# Patient Record
Sex: Female | Born: 1960 | Race: Black or African American | Hispanic: No | Marital: Married | Smoking: Never smoker
Health system: Southern US, Community
[De-identification: ages and names within clinical notes are randomized; demographics above are authoritative.]

## PROBLEM LIST (undated history)

## (undated) DIAGNOSIS — I10 Essential (primary) hypertension: Secondary | ICD-10-CM

## (undated) HISTORY — PX: APPENDECTOMY: SHX54

---

## 1999-06-29 ENCOUNTER — Encounter: Payer: Self-pay | Admitting: Obstetrics & Gynecology

## 1999-06-29 ENCOUNTER — Inpatient Hospital Stay (HOSPITAL_COMMUNITY): Admission: AD | Admit: 1999-06-29 | Discharge: 1999-06-29 | Payer: Self-pay | Admitting: Obstetrics & Gynecology

## 2005-03-13 ENCOUNTER — Ambulatory Visit: Payer: Self-pay | Admitting: Obstetrics and Gynecology

## 2006-01-24 ENCOUNTER — Ambulatory Visit: Payer: Self-pay | Admitting: Obstetrics and Gynecology

## 2007-01-28 ENCOUNTER — Ambulatory Visit: Payer: Self-pay | Admitting: Obstetrics and Gynecology

## 2008-01-21 ENCOUNTER — Ambulatory Visit: Payer: Self-pay | Admitting: Obstetrics and Gynecology

## 2008-01-30 ENCOUNTER — Ambulatory Visit: Payer: Self-pay | Admitting: Obstetrics and Gynecology

## 2009-05-12 ENCOUNTER — Ambulatory Visit: Payer: Self-pay

## 2010-05-17 ENCOUNTER — Ambulatory Visit: Payer: Self-pay

## 2010-06-29 ENCOUNTER — Ambulatory Visit (HOSPITAL_COMMUNITY): Admission: RE | Admit: 2010-06-29 | Discharge: 2010-06-29 | Payer: Self-pay | Admitting: Internal Medicine

## 2010-10-23 ENCOUNTER — Emergency Department: Payer: Self-pay | Admitting: Emergency Medicine

## 2011-09-10 ENCOUNTER — Ambulatory Visit: Payer: Self-pay | Admitting: Obstetrics and Gynecology

## 2013-07-16 ENCOUNTER — Ambulatory Visit: Payer: Self-pay | Admitting: Obstetrics and Gynecology

## 2014-11-15 ENCOUNTER — Encounter: Payer: Self-pay | Admitting: *Deleted

## 2015-01-06 ENCOUNTER — Other Ambulatory Visit: Payer: Self-pay | Admitting: Internal Medicine

## 2015-01-06 DIAGNOSIS — M19111 Post-traumatic osteoarthritis, right shoulder: Secondary | ICD-10-CM

## 2015-01-06 DIAGNOSIS — M25511 Pain in right shoulder: Secondary | ICD-10-CM

## 2015-01-13 ENCOUNTER — Ambulatory Visit
Admission: RE | Admit: 2015-01-13 | Discharge: 2015-01-13 | Disposition: A | Discharge status: Home / Self Care | Payer: BLUE CROSS/BLUE SHIELD | Source: Ambulatory Visit | Attending: Internal Medicine | Admitting: Internal Medicine

## 2015-01-13 DIAGNOSIS — M19111 Post-traumatic osteoarthritis, right shoulder: Secondary | ICD-10-CM

## 2015-01-13 DIAGNOSIS — M25511 Pain in right shoulder: Secondary | ICD-10-CM

## 2015-01-20 ENCOUNTER — Ambulatory Visit: Admission: RE | Admit: 2015-01-20 | Payer: BLUE CROSS/BLUE SHIELD | Source: Ambulatory Visit

## 2015-02-08 ENCOUNTER — Other Ambulatory Visit: Payer: Self-pay | Admitting: Internal Medicine

## 2015-02-08 DIAGNOSIS — Z1231 Encounter for screening mammogram for malignant neoplasm of breast: Secondary | ICD-10-CM

## 2015-02-09 ENCOUNTER — Ambulatory Visit
Admission: RE | Admit: 2015-02-09 | Discharge: 2015-02-09 | Disposition: A | Discharge status: Home / Self Care | Payer: BLUE CROSS/BLUE SHIELD | Source: Ambulatory Visit | Attending: Internal Medicine | Admitting: Internal Medicine

## 2015-02-09 DIAGNOSIS — Z1231 Encounter for screening mammogram for malignant neoplasm of breast: Secondary | ICD-10-CM | POA: Diagnosis present

## 2015-03-16 ENCOUNTER — Encounter: Payer: Self-pay | Admitting: *Deleted

## 2015-03-17 ENCOUNTER — Ambulatory Visit: Payer: BLUE CROSS/BLUE SHIELD | Admitting: Anesthesiology

## 2015-03-17 ENCOUNTER — Encounter
Admission: RE | Disposition: A | Discharge status: Home / Self Care | Payer: Self-pay | Source: Ambulatory Visit | Attending: Gastroenterology

## 2015-03-17 ENCOUNTER — Ambulatory Visit
Admission: RE | Admit: 2015-03-17 | Discharge: 2015-03-17 | Disposition: A | Discharge status: Home / Self Care | Payer: BLUE CROSS/BLUE SHIELD | Source: Ambulatory Visit | Attending: Gastroenterology | Admitting: Gastroenterology

## 2015-03-17 ENCOUNTER — Encounter: Payer: Self-pay | Admitting: *Deleted

## 2015-03-17 DIAGNOSIS — I10 Essential (primary) hypertension: Secondary | ICD-10-CM | POA: Diagnosis not present

## 2015-03-17 DIAGNOSIS — Z1211 Encounter for screening for malignant neoplasm of colon: Secondary | ICD-10-CM | POA: Insufficient documentation

## 2015-03-17 HISTORY — DX: Essential (primary) hypertension: I10

## 2015-03-17 HISTORY — PX: COLONOSCOPY WITH PROPOFOL: SHX5780

## 2015-03-17 SURGERY — COLONOSCOPY WITH PROPOFOL
Anesthesia: General

## 2015-03-17 MED ORDER — SODIUM CHLORIDE 0.9 % IV SOLN
INTRAVENOUS | Status: DC
  Administered 2015-03-17: 10:00:00 via INTRAVENOUS

## 2015-03-17 MED ORDER — SODIUM CHLORIDE 0.9 % IV SOLN: INTRAVENOUS | Status: DC

## 2015-03-17 MED ORDER — PROPOFOL 10 MG/ML IV BOLUS
INTRAVENOUS | Status: DC | PRN
  Administered 2015-03-17: 100 mg via INTRAVENOUS

## 2015-03-17 MED ORDER — PHENYLEPHRINE HCL 10 MG/ML IJ SOLN
INTRAMUSCULAR | Status: DC | PRN
  Administered 2015-03-17: 100 ug via INTRAVENOUS

## 2015-03-17 MED ORDER — LIDOCAINE HCL (CARDIAC) 20 MG/ML IV SOLN
INTRAVENOUS | Status: DC | PRN
  Administered 2015-03-17: 20 mg via INTRAVENOUS

## 2015-03-17 MED ORDER — PROPOFOL INFUSION 10 MG/ML OPTIME
INTRAVENOUS | Status: DC | PRN
  Administered 2015-03-17: 100 ug/kg/min via INTRAVENOUS

## 2015-03-17 NOTE — H&P (Signed)
  Primary Care Physician:  Luna Fuse, MD  Pre-Procedure History & Physical: HPI:  Vanessa Walters is a 54 y.o. female is here for an colonoscopy.   Past Medical History  Diagnosis Date  . Hypertension     Past Surgical History  Procedure Laterality Date  . Appendectomy    . Cesarean section      Prior to Admission medications   Medication Sig Start Date End Date Taking? Authorizing Provider  ascorbic acid (VITAMIN C) 500 MG tablet Take 500 mg by mouth daily.   Yes Historical Provider, MD  calcium carbonate (OS-CAL) 600 MG TABS tablet Take 600 mg by mouth 2 (two) times daily with a meal.   Yes Historical Provider, MD  cyclobenzaprine (FLEXERIL) 5 MG tablet Take 5 mg by mouth 3 (three) times daily as needed for muscle spasms.   Yes Historical Provider, MD  lisinopril-hydrochlorothiazide (PRINZIDE,ZESTORETIC) 20-25 MG per tablet Take 1 tablet by mouth daily.   Yes Historical Provider, MD  Multiple Vitamin (MULTIVITAMIN) tablet Take 1 tablet by mouth daily.   Yes Historical Provider, MD    Allergies as of 03/13/2015  . (Not on File)    History reviewed. No pertinent family history.  History   Social History  . Marital Status: Married    Spouse Name: N/A  . Number of Children: N/A  . Years of Education: N/A   Occupational History  . Not on file.   Social History Main Topics  . Smoking status: Never Smoker   . Smokeless tobacco: Not on file  . Alcohol Use: No  . Drug Use: No  . Sexual Activity: Not on file   Other Topics Concern  . Not on file   Social History Narrative     Physical Exam: BP 122/84 mmHg  Pulse 72  Temp(Src) 98.1 F (36.7 C) (Tympanic)  Resp 22  Ht  (1.676 m)  Wt 107.956 kg (238 lb)  BMI 38.43 kg/m2  SpO2 100% General:   Alert,  pleasant and cooperative in NAD Head:  Normocephalic and atraumatic. Neck:  Supple; no masses or thyromegaly. Lungs:  Clear throughout to auscultation.    Heart:  Regular rate and  rhythm. Abdomen:  Soft, nontender and nondistended. Normal bowel sounds, without guarding, and without rebound.   Neurologic:  Alert and  oriented x4;  grossly normal neurologically.  Impression/Plan: Vanessa Walters is here for an colonoscopy to be performed for screening  Risks, benefits, limitations, and alternatives regarding  colonoscopy have been reviewed with the patient.  Questions have been answered.  All parties agreeable.   Vanessa Maxwell, MD  03/17/2015, 10:17 AM

## 2015-03-17 NOTE — Anesthesia Postprocedure Evaluation (Signed)
  Anesthesia Post-op Note  Patient: Vanessa Walters  Procedure(s) Performed: Procedure(s): COLONOSCOPY WITH PROPOFOL (N/A)  Anesthesia type:General  Patient location: PACU  Post pain: Pain level controlled  Post assessment: Post-op Vital signs reviewed, Patient's Cardiovascular Status Stable, Respiratory Function Stable, Patent Airway and No signs of Nausea or vomiting  Post vital signs: Reviewed and stable  Last Vitals:  Filed Vitals:   03/17/15 1125  BP:   Pulse: 64  Temp:   Resp:     Level of consciousness: awake, alert  and patient cooperative  Complications: No apparent anesthesia complications

## 2015-03-17 NOTE — Anesthesia Preprocedure Evaluation (Addendum)
Anesthesia Evaluation  Patient identified by MRN, date of birth, ID band Patient awake    Reviewed: Allergy & Precautions, H&P , NPO status , Patient's Chart, lab work & pertinent test results, reviewed documented beta blocker date and time   Airway Mallampati: III  TM Distance: >3 FB Neck ROM: limited    Dental no notable dental hx. (+) Teeth Intact   Pulmonary neg pulmonary ROS,  breath sounds clear to auscultation  Pulmonary exam normal       Cardiovascular hypertension, Normal cardiovascular examRhythm:regular Rate:Normal     Neuro/Psych negative neurological ROS  negative psych ROS   GI/Hepatic negative GI ROS, Neg liver ROS,   Endo/Other  negative endocrine ROSMorbid obesity  Renal/GU negative Renal ROS  negative genitourinary   Musculoskeletal   Abdominal   Peds  Hematology negative hematology ROS (+)   Anesthesia Other Findings Past Medical History:   Hypertension                                                 Signs and symptoms suggestive of sleep apnea    Reproductive/Obstetrics negative OB ROS                            Anesthesia Physical Anesthesia Plan  ASA: III  Anesthesia Plan: General   Post-op Pain Management:    Induction:   Airway Management Planned:   Additional Equipment:   Intra-op Plan:   Post-operative Plan:   Informed Consent: I have reviewed the patients History and Physical, chart, labs and discussed the procedure including the risks, benefits and alternatives for the proposed anesthesia with the patient or authorized representative who has indicated his/her understanding and acceptance.   Dental Advisory Given  Plan Discussed with: Anesthesiologist, CRNA and Surgeon  Anesthesia Plan Comments:         Anesthesia Quick Evaluation

## 2015-03-17 NOTE — Op Note (Signed)
[facility] Gastroenterology Patient Name: Vanessa Walters Procedure Date: 03/17/2015 10:18 AM MRN: [id number] Account #: [id number] Date of Birth: 1961-05-20 Admit Type: Outpatient Age: 54 Room: [facility] ENDO ROOM 1 Gender: Female Note Status: Finalized Procedure:         Colonoscopy Indications:       Screening for colorectal malignant neoplasm, Last                     colonoscopy well over 10 years ago Patient Profile:   This is a 54 year old female. Providers:         Rhona Raider. Shelle Iron, MD Referring MD:      Silas Flood. Ellsworth Lennox, MD (Referring MD) Medicines:         Propofol per Anesthesia Complications:     No immediate complications. Procedure:         Pre-Anesthesia Assessment:                    - Prior to the procedure, a History and Physical was                     performed, and patient medications, allergies and                     sensitivities were reviewed. The patient's tolerance of                     previous anesthesia was reviewed.                    After obtaining informed consent, the colonoscope was                     passed under direct vision. Throughout the procedure, the                     patient's blood pressure, pulse, and oxygen saturations                     were monitored continuously. The Colonoscope was                     introduced through the anus and advanced to the the cecum,                     identified by appendiceal orifice and ileocecal valve. The                     colonoscopy was performed without difficulty. The patient                     tolerated the procedure well. The quality of the bowel                     preparation was excellent. Findings:      The perianal exam findings include non-thrombosed external hemorrhoids.      The entire examined colon appeared normal on direct and retroflexion       views. Impression:        - Non-thrombosed external hemorrhoids found on perianal                     exam.               - The entire examined colon is normal on direct  and                     retroflexion views.                    - No specimens collected. Recommendation:    - Observe patient in GI recovery unit.                    - High fiber diet.                    - Continue present medications.                    - Repeat colonoscopy in 10 years for screening purposes.                    - Return to referring physician.                    - The findings and recommendations were discussed with the                     patient.                    - The findings and recommendations were discussed with the                     patient's family. Procedure Code(s): --- Professional ---                    [phone], Colonoscopy, flexible; diagnostic, including                     collection of specimen(s) by brushing or washing, when                     performed (separate procedure) CPT copyright 2014 [employer]. All rights reserved. The codes documented in this report are preliminary and upon coder review may  be revised to meet current compliance requirements. Kathalene Frames, MD 03/17/2015 10:50:42 AM This report has been signed electronically. Number of Addenda: 0 Note Initiated On: 03/17/2015 10:18 AM Scope Withdrawal Time: 0 hours 14 minutes 18 seconds  Total Procedure Duration: 0 hours 18 minutes 24 seconds       [facility]

## 2015-03-17 NOTE — Transfer of Care (Signed)
Immediate Anesthesia Transfer of Care Note  Patient: Vanessa Walters  Procedure(s) Performed: Procedure(s): COLONOSCOPY WITH PROPOFOL (N/A)  Patient Location: PACU and Endoscopy Unit  Anesthesia Type:General  Level of Consciousness: awake  Airway & Oxygen Therapy: Patient Spontanous Breathing and Patient connected to nasal cannula oxygen  Post-op Assessment: Report given to RN and Post -op Vital signs reviewed and stable  Post vital signs: Reviewed and stable  Last Vitals:  Filed Vitals:   03/17/15 0956  BP: 122/84  Pulse: 72  Temp: 36.7 C  Resp: 22    Complications: No apparent anesthesia complications

## 2015-03-17 NOTE — Discharge Instructions (Signed)

## 2016-05-03 ENCOUNTER — Other Ambulatory Visit: Payer: Self-pay | Admitting: Internal Medicine

## 2016-05-03 DIAGNOSIS — Z1231 Encounter for screening mammogram for malignant neoplasm of breast: Secondary | ICD-10-CM

## 2016-05-24 ENCOUNTER — Ambulatory Visit
Admission: RE | Admit: 2016-05-24 | Discharge: 2016-05-24 | Disposition: A | Discharge status: Home / Self Care | Payer: BLUE CROSS/BLUE SHIELD | Source: Ambulatory Visit | Attending: Internal Medicine | Admitting: Internal Medicine

## 2016-05-24 DIAGNOSIS — Z1231 Encounter for screening mammogram for malignant neoplasm of breast: Secondary | ICD-10-CM | POA: Diagnosis present

## 2016-08-07 IMAGING — MG MM DIGITAL SCREENING BILATERAL
5 series · 5 of 5 positions shown · non-contrast
Comparison: Previous exam(s).

CLINICAL DATA: Screening.

EXAM:
DIGITAL SCREENING BILATERAL MAMMOGRAM WITH CAD

[R CC]
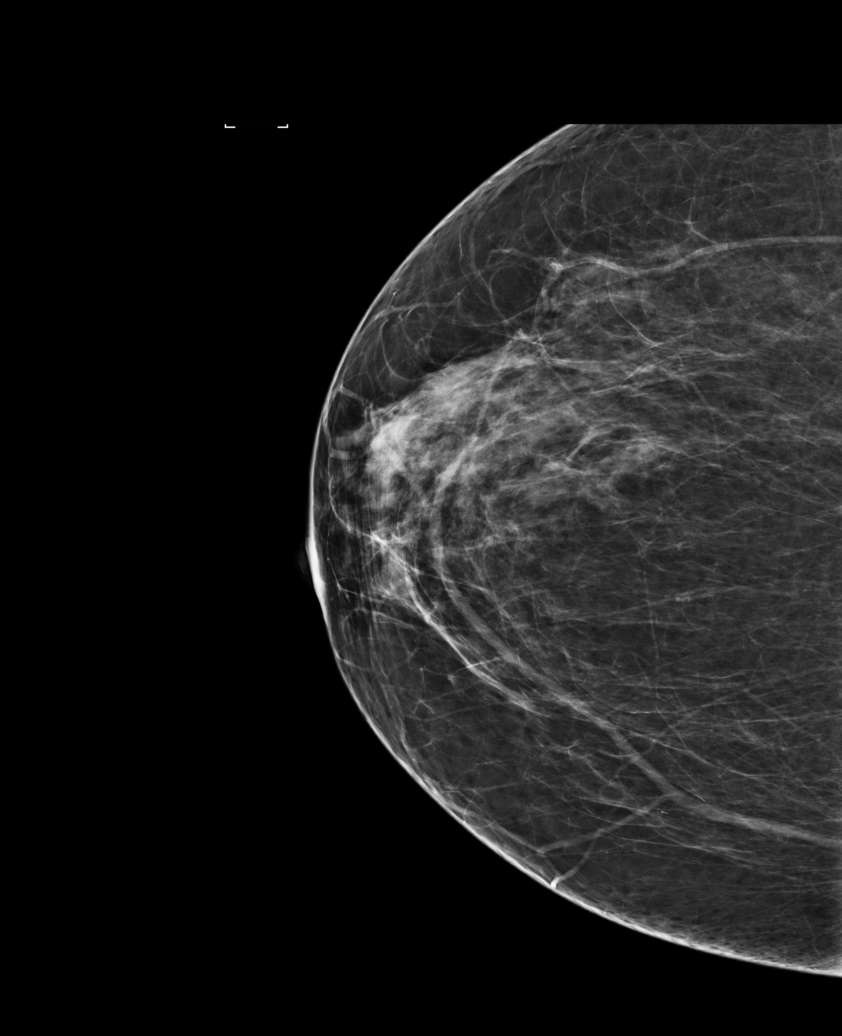

[R MLO]
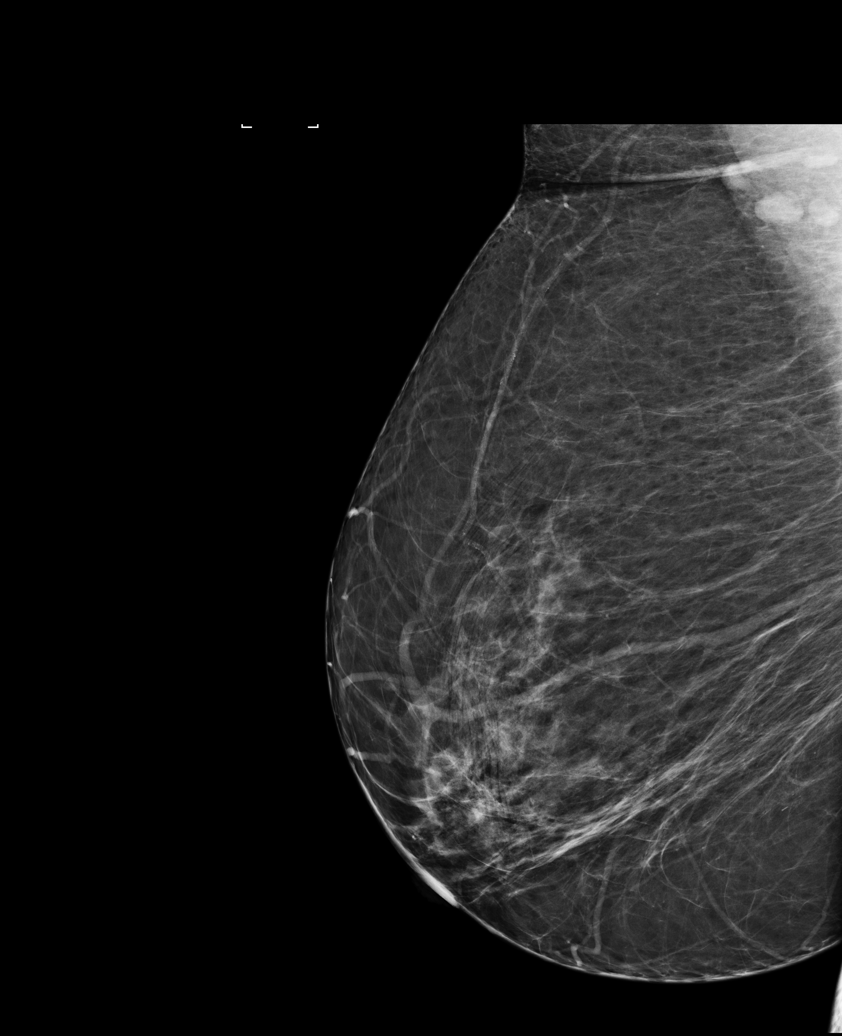

[L MLO (1 of 2)]
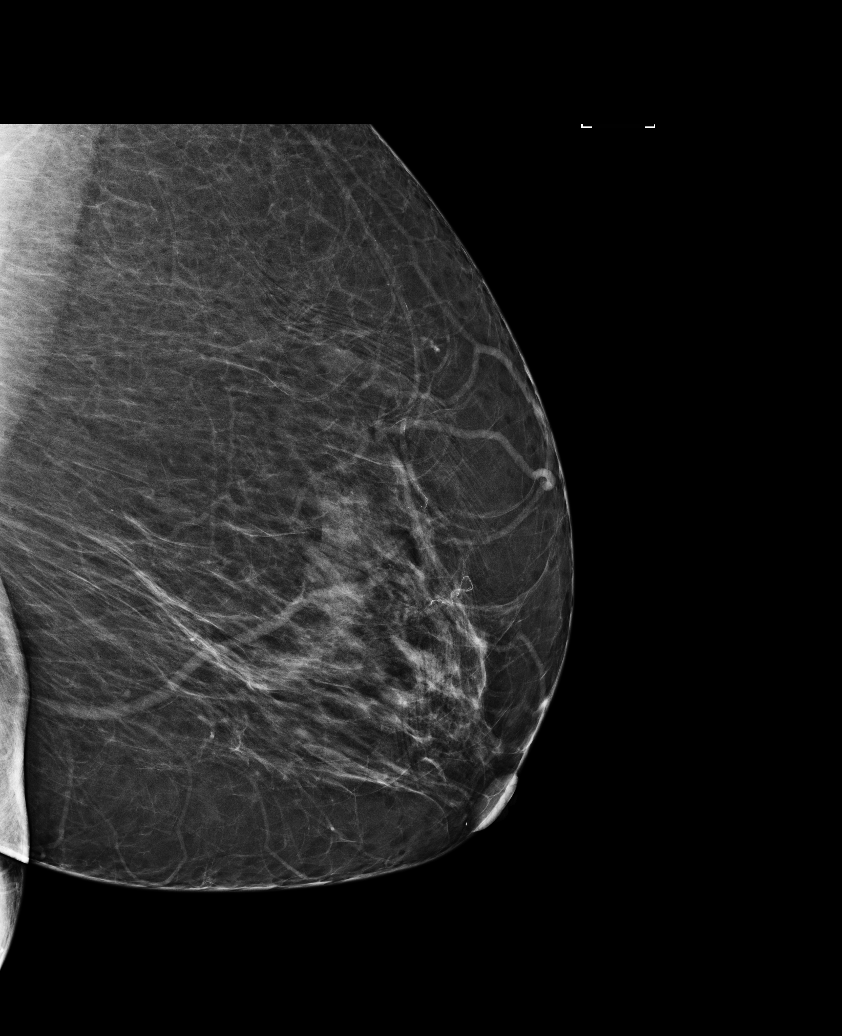

[L CC]
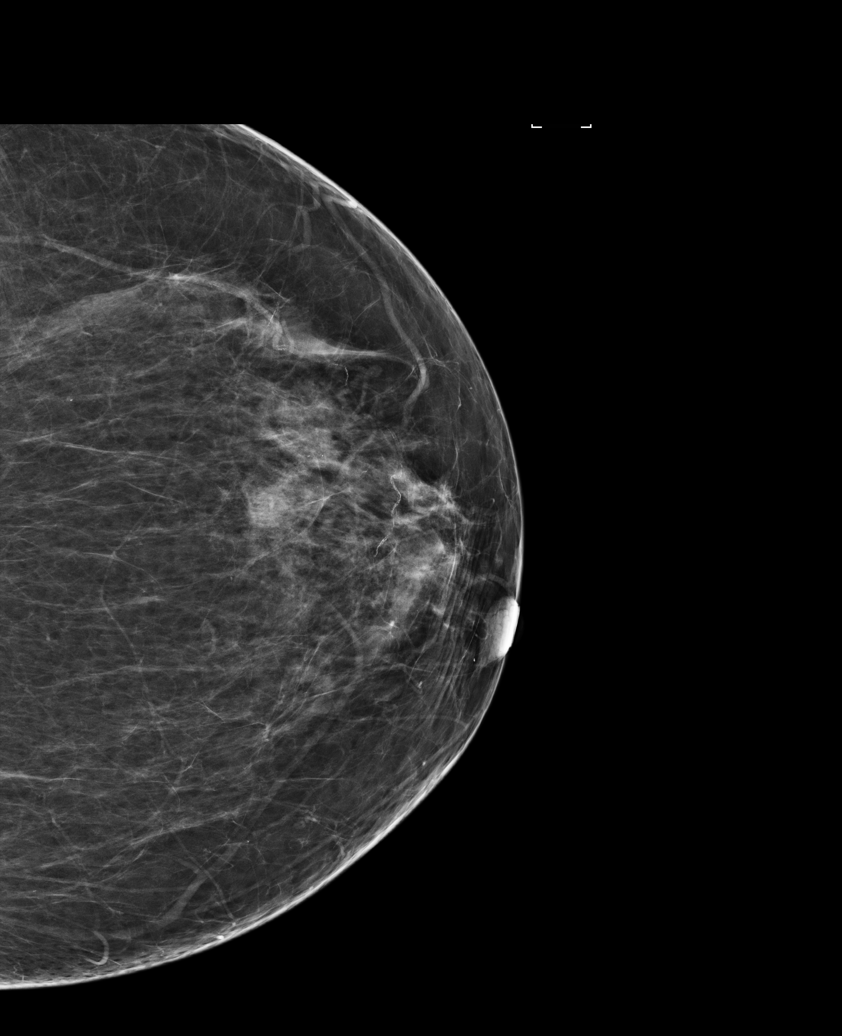

[L MLO (2 of 2)]
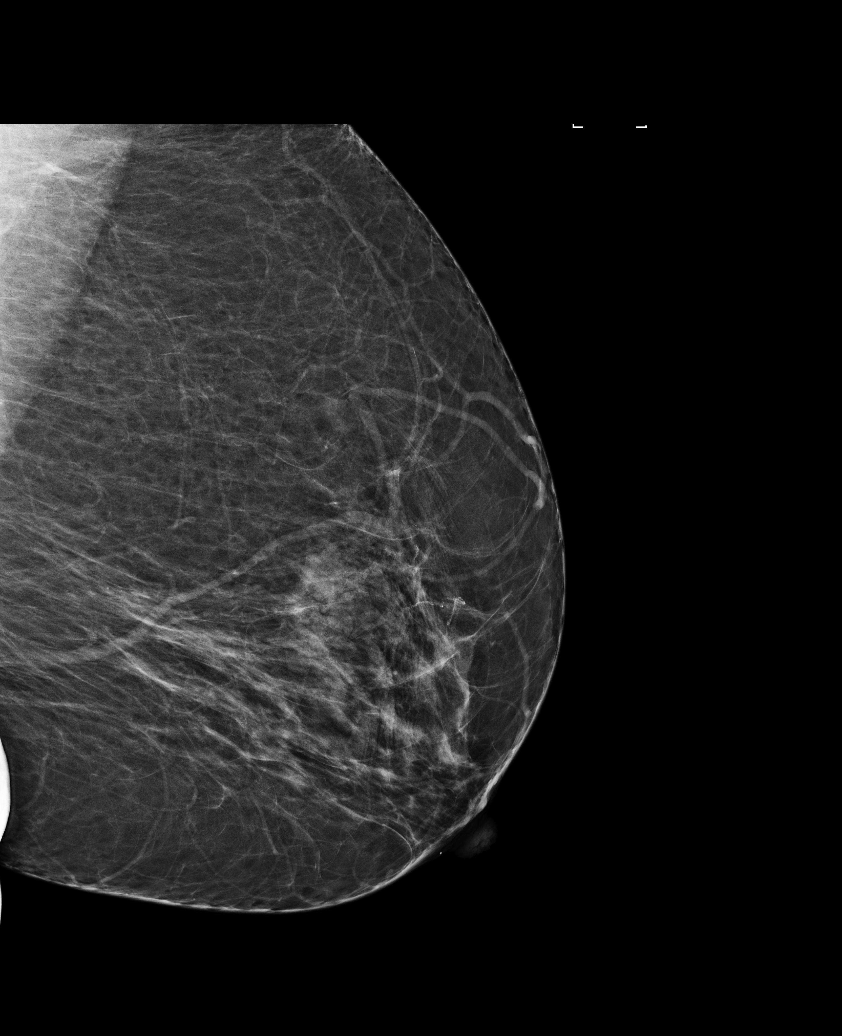

[5 of 5 positions shown; findings below may reference images not displayed]

ACR Breast Density Category c: The breast tissue is heterogeneously
dense, which may obscure small masses.
FINDINGS: There are no findings suspicious for malignancy. Images were
processed with CAD.
IMPRESSION: No mammographic evidence of malignancy. A result letter of this
screening mammogram will be mailed directly to the patient.

RECOMMENDATION:
Screening mammogram in one year. (Code:YJ-2-FEZ)

BI-RADS CATEGORY  1: Negative.

## 2017-12-11 ENCOUNTER — Other Ambulatory Visit: Payer: Self-pay | Admitting: Internal Medicine

## 2017-12-11 DIAGNOSIS — Z1231 Encounter for screening mammogram for malignant neoplasm of breast: Secondary | ICD-10-CM

## 2017-12-13 ENCOUNTER — Ambulatory Visit
Admission: RE | Admit: 2017-12-13 | Discharge: 2017-12-13 | Disposition: A | Discharge status: Home / Self Care | Payer: BLUE CROSS/BLUE SHIELD | Source: Ambulatory Visit | Attending: Internal Medicine | Admitting: Internal Medicine

## 2017-12-13 DIAGNOSIS — Z1231 Encounter for screening mammogram for malignant neoplasm of breast: Secondary | ICD-10-CM

## 2020-04-07 ENCOUNTER — Other Ambulatory Visit: Payer: Self-pay

## 2020-06-21 ENCOUNTER — Other Ambulatory Visit: Payer: Self-pay | Admitting: Internal Medicine

## 2020-06-21 DIAGNOSIS — Z1231 Encounter for screening mammogram for malignant neoplasm of breast: Secondary | ICD-10-CM

## 2020-10-07 ENCOUNTER — Other Ambulatory Visit: Payer: Self-pay

## 2020-10-07 ENCOUNTER — Ambulatory Visit
Admission: RE | Admit: 2020-10-07 | Discharge: 2020-10-07 | Disposition: A | Discharge status: Home / Self Care | Payer: BC Managed Care – PPO | Source: Ambulatory Visit | Attending: Internal Medicine | Admitting: Internal Medicine

## 2020-10-07 DIAGNOSIS — Z1231 Encounter for screening mammogram for malignant neoplasm of breast: Secondary | ICD-10-CM | POA: Insufficient documentation

## 2022-10-07 ENCOUNTER — Other Ambulatory Visit: Payer: Self-pay | Admitting: Internal Medicine

## 2022-10-12 ENCOUNTER — Other Ambulatory Visit: Payer: Self-pay | Admitting: Internal Medicine

## 2022-10-22 ENCOUNTER — Other Ambulatory Visit: Payer: Self-pay | Admitting: Internal Medicine

## 2022-11-06 ENCOUNTER — Other Ambulatory Visit: Payer: Self-pay | Admitting: Internal Medicine

## 2022-11-06 ENCOUNTER — Ambulatory Visit: Payer: BC Managed Care – PPO | Admitting: Internal Medicine

## 2022-11-06 ENCOUNTER — Encounter: Payer: Self-pay | Admitting: Internal Medicine

## 2022-11-06 VITALS — BP 112/80 | HR 87 | Temp 97.7°F | Ht 66.0 in | Wt 218.2 lb

## 2022-11-06 DIAGNOSIS — I10 Essential (primary) hypertension: Secondary | ICD-10-CM

## 2022-11-06 DIAGNOSIS — E782 Mixed hyperlipidemia: Secondary | ICD-10-CM

## 2022-11-06 DIAGNOSIS — E119 Type 2 diabetes mellitus without complications: Secondary | ICD-10-CM

## 2022-11-06 MED ORDER — METFORMIN HCL 500 MG PO TABS: 500.0000 mg | ORAL_TABLET | Freq: Two times a day (BID) | ORAL | 0 refills | Status: DC

## 2022-11-06 MED ORDER — ATORVASTATIN CALCIUM 10 MG PO TABS: 10.0000 mg | ORAL_TABLET | Freq: Every day | ORAL | 0 refills | Status: DC

## 2022-11-06 MED ORDER — MEFLOQUINE HCL 250 MG PO TABS: 250.0000 mg | ORAL_TABLET | ORAL | 0 refills | Status: AC

## 2022-11-06 MED ORDER — SEMAGLUTIDE (1 MG/DOSE) 4 MG/3ML ~~LOC~~ SOPN: 1.0000 mg | PEN_INJECTOR | SUBCUTANEOUS | 0 refills | Status: DC

## 2022-11-06 NOTE — Addendum Note (Signed)
Addended byHerbert Seta on: 11/06/2022 01:37 PM   Modules accepted: Orders

## 2022-11-06 NOTE — Addendum Note (Signed)
Addended by: Azzie Roup AHMAD on: 11/06/2022 02:19 PM   Modules accepted: Orders

## 2022-11-06 NOTE — Progress Notes (Signed)
Established Patient Office Visit  Subjective:  Patient ID: Vanessa Walters, female    DOB: August 27, 1960  Age: 62 y.o. MRN: [id number]  Chief Complaint  Patient presents with   Follow-up    Follow up    No new complaints, here for medication refills. Continues to lose weight with Ozempic.    No other concerns at this time.   Past Medical History:  Diagnosis Date   Hypertension     Past Surgical History:  Procedure Laterality Date   APPENDECTOMY     CESAREAN SECTION     COLONOSCOPY WITH PROPOFOL N/A 03/17/2015   Procedure: COLONOSCOPY WITH PROPOFOL;  Surgeon: Josefine Class, MD;  Location: [facility] ENDOSCOPY;  Service: Endoscopy;  Laterality: N/A;    Social History   Socioeconomic History   Marital status: Married    Spouse name: Not on file   Number of children: Not on file   Years of education: Not on file   Highest education level: Not on file  Occupational History   Not on file  Tobacco Use   Smoking status: Never   Smokeless tobacco: Not on file  Substance and Sexual Activity   Alcohol use: No   Drug use: No   Sexual activity: Not on file  Other Topics Concern   Not on file  Social History Narrative   Not on file   Social Determinants of Health   Financial Resource Strain: Not on file  Food Insecurity: Not on file  Transportation Needs: Not on file  Physical Activity: Not on file  Stress: Not on file  Social Connections: Not on file  Intimate Partner Violence: Not on file    Family History  Problem Relation Age of Onset   Breast cancer Neg Hx     No Known Allergies  Review of Systems  Constitutional:  Positive for weight loss.  HENT: Negative.    Eyes: Negative.   Respiratory: Negative.    Cardiovascular: Negative.   Gastrointestinal: Negative.   Genitourinary: Negative.   Skin: Negative.   Neurological: Negative.   Endo/Heme/Allergies: Negative.        Objective:   BP 112/80   Pulse 87   Temp 97.7 F (36.5 C)   Ht 5\' 6"   (1.676 m)   Wt 218 lb 3.2 oz (99 kg)   SpO2 97%   BMI 35.22 kg/m   Vitals:   11/06/22 1127  BP: 112/80  Pulse: 87  Temp: 97.7 F (36.5 C)  Height: 5\' 6"  (1.676 m)  Weight: 218 lb 3.2 oz (99 kg)  SpO2: 97%  BMI (Calculated): 35.24    Physical Exam Vitals reviewed.  Constitutional:      General: She is not in acute distress.    Appearance: She is obese.  HENT:     Head: Normocephalic.     Nose: Nose normal.     Mouth/Throat:     Mouth: Mucous membranes are moist.  Eyes:     Extraocular Movements: Extraocular movements intact.     Pupils: Pupils are equal, round, and reactive to light.  Cardiovascular:     Rate and Rhythm: Normal rate and regular rhythm.     Heart sounds: No murmur heard. Pulmonary:     Effort: Pulmonary effort is normal.     Breath sounds: No rhonchi or rales.  Abdominal:     General: Abdomen is flat.     Palpations: There is no hepatomegaly, splenomegaly or mass.  Musculoskeletal:  General: Normal range of motion.     Cervical back: Normal range of motion. No tenderness.  Skin:    General: Skin is warm and dry.  Neurological:     General: No focal deficit present.     Mental Status: She is alert and oriented to person, place, and time.     Cranial Nerves: No cranial nerve deficit.     Motor: No weakness.  Psychiatric:        Mood and Affect: Mood normal.        Behavior: Behavior normal.      No results found for any visits on 11/06/22.  No results found for this or any previous visit (from the past 2160 hour(Yarah Fuente)).    Assessment & Plan:   Problem List Items Addressed This Visit   None   No follow-ups on file.   Total time spent: 30 minutes  Volanda Napoleon, MD  11/06/2022

## 2022-11-07 LAB — LIPID PANEL
Chol/HDL Ratio: 2.9 ratio (ref 0.0–4.4)
Cholesterol, Total: 122 mg/dL (ref 100–199)
HDL: 42 mg/dL (ref >39)
LDL Chol Calc (NIH): 60 mg/dL (ref 0–99)
Triglycerides: 111 mg/dL (ref 0–149)
VLDL Cholesterol Cal: 20 mg/dL (ref 5–40)

## 2022-11-07 LAB — COMPREHENSIVE METABOLIC PANEL
ALT: 15 IU/L (ref 0–32)
AST: 16 IU/L (ref 0–40)
Albumin/Globulin Ratio: 1.3 (ref 1.2–2.2)
Albumin: 4 g/dL (ref 3.9–4.9)
Alkaline Phosphatase: 61 IU/L (ref 44–121)
BUN/Creatinine Ratio: 18 (ref 12–28)
BUN: 14 mg/dL (ref 8–27)
Bilirubin Total: 0.4 mg/dL (ref 0.0–1.2)
CO2: 23 mmol/L (ref 20–29)
Calcium: 9.7 mg/dL (ref 8.7–10.3)
Chloride: 100 mmol/L (ref 96–106)
Creatinine, Ser: 0.79 mg/dL (ref 0.57–1.00)
Globulin, Total: 3.2 g/dL (ref 1.5–4.5)
Glucose: 91 mg/dL (ref 70–99)
Potassium: 3.7 mmol/L (ref 3.5–5.2)
Sodium: 141 mmol/L (ref 134–144)
Total Protein: 7.2 g/dL (ref 6.0–8.5)
eGFR: 85 mL/min/{1.73_m2} (ref >59)

## 2022-11-07 LAB — HEMOGLOBIN A1C
Est. average glucose Bld gHb Est-mCnc: 128 mg/dL
Hgb A1c MFr Bld: 6.1 % — ABNORMAL HIGH (ref 4.8–5.6)

## 2022-11-13 ENCOUNTER — Other Ambulatory Visit: Payer: Self-pay | Admitting: Internal Medicine

## 2022-11-28 ENCOUNTER — Other Ambulatory Visit: Payer: Self-pay | Admitting: Internal Medicine

## 2022-12-14 ENCOUNTER — Other Ambulatory Visit: Payer: Self-pay | Admitting: Nurse Practitioner

## 2023-02-09 ENCOUNTER — Other Ambulatory Visit: Payer: Self-pay | Admitting: Internal Medicine

## 2023-02-09 DIAGNOSIS — E119 Type 2 diabetes mellitus without complications: Secondary | ICD-10-CM

## 2023-02-12 ENCOUNTER — Other Ambulatory Visit: Payer: Self-pay | Admitting: Internal Medicine

## 2023-02-12 ENCOUNTER — Other Ambulatory Visit: Payer: Self-pay | Admitting: Nurse Practitioner

## 2023-02-12 DIAGNOSIS — E782 Mixed hyperlipidemia: Secondary | ICD-10-CM

## 2023-02-19 ENCOUNTER — Other Ambulatory Visit: Payer: Self-pay

## 2023-02-21 ENCOUNTER — Other Ambulatory Visit: Payer: Self-pay

## 2023-02-27 ENCOUNTER — Telehealth: Payer: Self-pay | Admitting: Internal Medicine

## 2023-02-27 ENCOUNTER — Other Ambulatory Visit: Payer: Self-pay | Admitting: Internal Medicine

## 2023-02-27 DIAGNOSIS — E119 Type 2 diabetes mellitus without complications: Secondary | ICD-10-CM

## 2023-02-27 MED ORDER — OZEMPIC (1 MG/DOSE) 4 MG/3ML ~~LOC~~ SOPN: 1.0000 mg | PEN_INJECTOR | SUBCUTANEOUS | 0 refills | Status: DC

## 2023-02-27 NOTE — Telephone Encounter (Signed)
[facility] called about patient's Ozempic that was supposed to be sent on 7/1. It looks as though this was accidentally printed and not sent electromically. Could you please resend?

## 2023-03-06 ENCOUNTER — Encounter: Payer: Self-pay | Admitting: Internal Medicine

## 2023-03-06 ENCOUNTER — Ambulatory Visit: Payer: BC Managed Care – PPO | Admitting: Internal Medicine

## 2023-03-06 VITALS — BP 140/96 | HR 80 | Ht 66.0 in | Wt 215.8 lb

## 2023-03-06 DIAGNOSIS — E6609 Other obesity due to excess calories: Secondary | ICD-10-CM

## 2023-03-06 DIAGNOSIS — E782 Mixed hyperlipidemia: Secondary | ICD-10-CM | POA: Diagnosis not present

## 2023-03-06 DIAGNOSIS — M25552 Pain in left hip: Secondary | ICD-10-CM

## 2023-03-06 DIAGNOSIS — E119 Type 2 diabetes mellitus without complications: Secondary | ICD-10-CM

## 2023-03-06 DIAGNOSIS — Z6834 Body mass index (BMI) 34.0-34.9, adult: Secondary | ICD-10-CM

## 2023-03-06 DIAGNOSIS — I10 Essential (primary) hypertension: Secondary | ICD-10-CM | POA: Diagnosis not present

## 2023-03-06 MED ORDER — MELOXICAM 15 MG PO TABS: 15.0000 mg | ORAL_TABLET | Freq: Every day | ORAL | 2 refills | Status: AC

## 2023-03-06 MED ORDER — OLMESARTAN MEDOXOMIL-HCTZ 40-25 MG PO TABS: 1.0000 | ORAL_TABLET | Freq: Every day | ORAL | 0 refills | Status: DC

## 2023-03-06 MED ORDER — OZEMPIC (2 MG/DOSE) 8 MG/3ML ~~LOC~~ SOPN
2.0000 mg | PEN_INJECTOR | SUBCUTANEOUS | 2 refills | Status: AC
Start: 2023-03-06 — End: 2023-05-29

## 2023-03-06 NOTE — Progress Notes (Signed)
Established Patient Office Visit  Subjective:  Patient ID: Vanessa Walters, female    DOB: 28-Sep-1960  Age: 62 y.o. MRN: [id number]  Chief Complaint  Patient presents with   Follow-up    Medication refills    C/o left hip and buttock pain x 1 yr, intermittent exacerbated by climbing, prolonged sitting or sudden twisting movements. Still losing weight on Ozempic.    No other concerns at this time.   Past Medical History:  Diagnosis Date   Hypertension     Past Surgical History:  Procedure Laterality Date   APPENDECTOMY     CESAREAN SECTION     COLONOSCOPY WITH PROPOFOL N/A 03/17/2015   Procedure: COLONOSCOPY WITH PROPOFOL;  Surgeon: Elnita Maxwell, MD;  Location: [facility] ENDOSCOPY;  Service: Endoscopy;  Laterality: N/A;    Social History   Socioeconomic History   Marital status: Married    Spouse name: Not on file   Number of children: Not on file   Years of education: Not on file   Highest education level: Not on file  Occupational History   Not on file  Tobacco Use   Smoking status: Never   Smokeless tobacco: Not on file  Substance and Sexual Activity   Alcohol use: No   Drug use: No   Sexual activity: Not on file  Other Topics Concern   Not on file  Social History Narrative   Not on file   Social Determinants of Health   Financial Resource Strain: Not on file  Food Insecurity: Not on file  Transportation Needs: Not on file  Physical Activity: Not on file  Stress: Not on file  Social Connections: Not on file  Intimate Partner Violence: Not on file    Family History  Problem Relation Age of Onset   Breast cancer Neg Hx     No Known Allergies  Review of Systems  Constitutional:  Positive for weight loss (20 lbs over 8 months).  HENT: Negative.    Eyes: Negative.   Respiratory: Negative.    Cardiovascular: Negative.   Gastrointestinal: Negative.   Genitourinary: Negative.   Skin: Negative.   Neurological: Negative.    Endo/Heme/Allergies: Negative.        Objective:   BP (!) 140/96   Pulse 80   Ht 5\' 6"  (1.676 m)   Wt 215 lb 12.8 oz (97.9 kg)   SpO2 100%   BMI 34.83 kg/m   Vitals:   03/06/23 0843  BP: (!) 140/96  Pulse: 80  Height: 5\' 6"  (1.676 m)  Weight: 215 lb 12.8 oz (97.9 kg)  SpO2: 100%  BMI (Calculated): 34.85    Physical Exam Vitals reviewed.  Constitutional:      General: She is not in acute distress.    Appearance: She is obese.  HENT:     Head: Normocephalic.     Nose: Nose normal.     Mouth/Throat:     Mouth: Mucous membranes are moist.  Eyes:     Extraocular Movements: Extraocular movements intact.     Pupils: Pupils are equal, round, and reactive to light.  Cardiovascular:     Rate and Rhythm: Normal rate and regular rhythm.     Heart sounds: No murmur heard. Pulmonary:     Effort: Pulmonary effort is normal.     Breath sounds: No rhonchi or rales.  Abdominal:     General: Abdomen is flat.     Palpations: There is no hepatomegaly, splenomegaly or mass.  Musculoskeletal:  General: Normal range of motion.     Cervical back: Normal range of motion. No tenderness.     Lumbar back: Bony tenderness (lumbar-sacral region) present.     Left hip: Tenderness (left bursa) present.  Skin:    General: Skin is warm and dry.  Neurological:     General: No focal deficit present.     Mental Status: She is alert and oriented to person, place, and time.     Cranial Nerves: No cranial nerve deficit.     Motor: No weakness.  Psychiatric:        Mood and Affect: Mood normal.        Behavior: Behavior normal.      No results found for any visits on 03/06/23.  No results found for this or any previous visit (from the past 2160 hour(Rayni Nemitz)).    Assessment & Plan:   As per problem list.  Problem List Items Addressed This Visit       Cardiovascular and Mediastinum   Primary hypertension   Relevant Medications   olmesartan-hydrochlorothiazide (BENICAR HCT)  40-25 MG tablet     Endocrine   Controlled type 2 diabetes mellitus without complication, without long-term current use of insulin (HCC) - Primary   Relevant Medications   olmesartan-hydrochlorothiazide (BENICAR HCT) 40-25 MG tablet   Semaglutide, 2 MG/DOSE, (OZEMPIC, 2 MG/DOSE,) 8 MG/3ML SOPN   Other Relevant Orders   Hemoglobin A1c     Other   Mixed hyperlipidemia   Relevant Medications   olmesartan-hydrochlorothiazide (BENICAR HCT) 40-25 MG tablet   Other Relevant Orders   Hepatic function panel   CK   Comprehensive metabolic panel   Lipid panel   Class 1 obesity due to excess calories without serious comorbidity with body mass index (BMI) of 34.0 to 34.9 in adult   Relevant Medications   Semaglutide, 2 MG/DOSE, (OZEMPIC, 2 MG/DOSE,) 8 MG/3ML SOPN   Other Visit Diagnoses     Arthralgia of left hip       Relevant Medications   meloxicam (MOBIC) 15 MG tablet   Other Relevant Orders   DG Hip Unilat W OR W/O Pelvis 2-3 Views Left       Return in about 3 months (around 06/06/2023) for fu with labs prior.   Total time spent: 30 minutes  Luna Fuse, MD  03/06/2023   This document may have been prepared by [facility] Voice Recognition software and as such may include unintentional dictation errors.

## 2023-03-07 LAB — HEPATIC FUNCTION PANEL
ALT: 14 IU/L (ref 0–32)
AST: 17 IU/L (ref 0–40)
Alkaline Phosphatase: 61 IU/L (ref 44–121)
Bilirubin Total: 0.5 mg/dL (ref 0.0–1.2)
Total Protein: 7.6 g/dL (ref 6.0–8.5)

## 2023-03-07 LAB — COMPREHENSIVE METABOLIC PANEL
ALT: 14 IU/L (ref 0–32)
AST: 19 IU/L (ref 0–40)
BUN/Creatinine Ratio: 10 — ABNORMAL LOW (ref 12–28)
BUN: 10 mg/dL (ref 8–27)
Bilirubin Total: 0.5 mg/dL (ref 0.0–1.2)
Calcium: 9.8 mg/dL (ref 8.7–10.3)
Chloride: 99 mmol/L (ref 96–106)
Creatinine, Ser: 0.97 mg/dL (ref 0.57–1.00)
Globulin, Total: 3.1 g/dL (ref 1.5–4.5)
Glucose: 81 mg/dL (ref 70–99)
Potassium: 4 mmol/L (ref 3.5–5.2)
Sodium: 139 mmol/L (ref 134–144)
Total Protein: 7.5 g/dL (ref 6.0–8.5)
eGFR: 66 mL/min/{1.73_m2} (ref >59)

## 2023-03-07 LAB — LIPID PANEL
Cholesterol, Total: 135 mg/dL (ref 100–199)
HDL: 50 mg/dL (ref >39)
Triglycerides: 88 mg/dL (ref 0–149)
VLDL Cholesterol Cal: 17 mg/dL (ref 5–40)

## 2023-03-07 LAB — CBC WITH DIFF/PLATELET
Basos: 1 %
EOS (ABSOLUTE): 0.2 10*3/uL (ref 0.0–0.4)
Eos: 3 %
Hematocrit: 40.6 % (ref 34.0–46.6)
Hemoglobin: 13.4 g/dL (ref 11.1–15.9)
Immature Grans (Abs): 0.1 10*3/uL (ref 0.0–0.1)
Immature Granulocytes: 1 %
Lymphocytes Absolute: 2.8 10*3/uL (ref 0.7–3.1)
Lymphs: 36 %
MCH: 28 pg (ref 26.6–33.0)
MCV: 85 fL (ref 79–97)
Monocytes Absolute: 0.5 10*3/uL (ref 0.1–0.9)
Monocytes: 6 %
Neutrophils Absolute: 4.2 10*3/uL (ref 1.4–7.0)
Neutrophils: 53 %
Platelets: 366 10*3/uL (ref 150–450)
RBC: 4.78 x10E6/uL (ref 3.77–5.28)
RDW: 15.1 % (ref 11.7–15.4)

## 2023-03-07 LAB — HEMOGLOBIN A1C
Est. average glucose Bld gHb Est-mCnc: 120 mg/dL
Hgb A1c MFr Bld: 5.8 % — ABNORMAL HIGH (ref 4.8–5.6)

## 2023-03-08 NOTE — Progress Notes (Signed)
Spoke with pt who verbalized understanding.

## 2023-03-12 ENCOUNTER — Ambulatory Visit (INDEPENDENT_AMBULATORY_CARE_PROVIDER_SITE_OTHER): Payer: BC Managed Care – PPO

## 2023-03-12 DIAGNOSIS — M25552 Pain in left hip: Secondary | ICD-10-CM | POA: Diagnosis not present

## 2023-03-13 ENCOUNTER — Other Ambulatory Visit: Payer: Self-pay | Admitting: Internal Medicine

## 2023-04-10 ENCOUNTER — Other Ambulatory Visit: Payer: Self-pay | Admitting: Internal Medicine

## 2023-04-10 DIAGNOSIS — E119 Type 2 diabetes mellitus without complications: Secondary | ICD-10-CM

## 2023-05-10 ENCOUNTER — Other Ambulatory Visit: Payer: Self-pay | Admitting: Internal Medicine

## 2023-05-10 DIAGNOSIS — E782 Mixed hyperlipidemia: Secondary | ICD-10-CM

## 2023-05-14 ENCOUNTER — Other Ambulatory Visit: Payer: Self-pay | Admitting: Internal Medicine

## 2023-05-14 DIAGNOSIS — E119 Type 2 diabetes mellitus without complications: Secondary | ICD-10-CM

## 2023-06-12 ENCOUNTER — Ambulatory Visit: Payer: BC Managed Care – PPO | Admitting: Internal Medicine

## 2023-06-12 VITALS — BP 125/78 | HR 78 | Ht 66.0 in | Wt 210.4 lb

## 2023-06-12 DIAGNOSIS — I10 Essential (primary) hypertension: Secondary | ICD-10-CM

## 2023-06-12 DIAGNOSIS — E119 Type 2 diabetes mellitus without complications: Secondary | ICD-10-CM | POA: Diagnosis not present

## 2023-06-12 DIAGNOSIS — E782 Mixed hyperlipidemia: Secondary | ICD-10-CM

## 2023-06-12 LAB — POC CREATINE & ALBUMIN,URINE
Albumin/Creatinine Ratio, Urine, POC: <30
Creatinine, POC: 10 mg/dL
Microalbumin Ur, POC: 10 mg/L

## 2023-06-12 MED ORDER — METFORMIN HCL 500 MG PO TABS: 500.0000 mg | ORAL_TABLET | Freq: Two times a day (BID) | ORAL | 0 refills | Status: DC

## 2023-06-12 MED ORDER — OLMESARTAN MEDOXOMIL-HCTZ 40-25 MG PO TABS
1.0000 | ORAL_TABLET | Freq: Every day | ORAL | 0 refills | Status: DC
Start: 2023-06-12 — End: 2023-09-09

## 2023-06-12 NOTE — Progress Notes (Signed)
Established Patient Office Visit  Subjective:  Patient ID: Vanessa Walters, female    DOB: September 09, 1960  Age: 62 y.o. MRN: [id number]  Chief Complaint  Patient presents with   Follow-up    3 mo f/u lab results    No new complaints, here for lab review and medication refills. Failed to have previsit labs done. Lost weight on GLP-1.     No other concerns at this time.   Past Medical History:  Diagnosis Date   Hypertension     Past Surgical History:  Procedure Laterality Date   APPENDECTOMY     CESAREAN SECTION     COLONOSCOPY WITH PROPOFOL N/A 03/17/2015   Procedure: COLONOSCOPY WITH PROPOFOL;  Surgeon: Elnita Maxwell, MD;  Location: [facility] ENDOSCOPY;  Service: Endoscopy;  Laterality: N/A;    Social History   Socioeconomic History   Marital status: Married    Spouse name: Not on file   Number of children: Not on file   Years of education: Not on file   Highest education level: Not on file  Occupational History   Not on file  Tobacco Use   Smoking status: Never   Smokeless tobacco: Not on file  Substance and Sexual Activity   Alcohol use: No   Drug use: No   Sexual activity: Not on file  Other Topics Concern   Not on file  Social History Narrative   Not on file   Social Determinants of Health   Financial Resource Strain: Not on file  Food Insecurity: Not on file  Transportation Needs: Not on file  Physical Activity: Not on file  Stress: Not on file  Social Connections: Not on file  Intimate Partner Violence: Not on file    Family History  Problem Relation Age of Onset   Breast cancer Neg Hx     No Known Allergies  Review of Systems  Constitutional:  Positive for weight loss (5 lbs).  HENT: Negative.    Eyes: Negative.   Respiratory: Negative.    Cardiovascular: Negative.   Gastrointestinal: Negative.   Genitourinary: Negative.   Skin: Negative.   Neurological: Negative.   Endo/Heme/Allergies: Negative.        Objective:   BP  125/78   Pulse 78   Ht 5\' 6"  (1.676 m)   Wt 210 lb 6.4 oz (95.4 kg)   SpO2 100%   BMI 33.96 kg/m   Vitals:   06/12/23 0903  BP: 125/78  Pulse: 78  Height: 5\' 6"  (1.676 m)  Weight: 210 lb 6.4 oz (95.4 kg)  SpO2: 100%  BMI (Calculated): 33.98    Physical Exam Vitals reviewed.  Constitutional:      General: She is not in acute distress.    Appearance: She is obese.  HENT:     Head: Normocephalic.     Nose: Nose normal.     Mouth/Throat:     Mouth: Mucous membranes are moist.  Eyes:     Extraocular Movements: Extraocular movements intact.     Pupils: Pupils are equal, round, and reactive to light.  Cardiovascular:     Rate and Rhythm: Normal rate and regular rhythm.     Heart sounds: No murmur heard. Pulmonary:     Effort: Pulmonary effort is normal.     Breath sounds: No rhonchi or rales.  Abdominal:     General: Abdomen is flat.     Palpations: There is no hepatomegaly, splenomegaly or mass.  Musculoskeletal:  General: Normal range of motion.     Cervical back: Normal range of motion. No tenderness.     Lumbar back: Bony tenderness (lumbar-sacral region) present.     Left hip: Tenderness (left bursa) present.  Skin:    General: Skin is warm and dry.  Neurological:     General: No focal deficit present.     Mental Status: She is alert and oriented to person, place, and time.     Cranial Nerves: No cranial nerve deficit.     Motor: No weakness.  Psychiatric:        Mood and Affect: Mood normal.        Behavior: Behavior normal.      No results found for any visits on 06/12/23.  No results found for this or any previous visit (from the past 2160 hour(Jarian Longoria)).    Assessment & Plan:  As per problem list  Problem List Items Addressed This Visit       Cardiovascular and Mediastinum   Primary hypertension   Relevant Medications   olmesartan-hydrochlorothiazide (BENICAR HCT) 40-25 MG tablet     Endocrine   Controlled type 2 diabetes mellitus without  complication, without long-term current use of insulin (HCC) - Primary   Relevant Medications   metFORMIN (GLUCOPHAGE) 500 MG tablet   olmesartan-hydrochlorothiazide (BENICAR HCT) 40-25 MG tablet   Other Relevant Orders   POCT CBG (Fasting - Glucose)   POC CREATINE & ALBUMIN,URINE     Other   Mixed hyperlipidemia   Relevant Medications   olmesartan-hydrochlorothiazide (BENICAR HCT) 40-25 MG tablet    Return in about 3 months (around 09/12/2023) for fu with labs prior.   Total time spent: 20 minutes  Luna Fuse, MD  06/12/2023   This document may have been prepared by [facility] Voice Recognition software and as such may include unintentional dictation errors.

## 2023-06-13 LAB — COMPREHENSIVE METABOLIC PANEL
ALT: 13 [IU]/L (ref 0–32)
AST: 17 [IU]/L (ref 0–40)
Albumin: 4 g/dL (ref 3.9–4.9)
Alkaline Phosphatase: 58 [IU]/L (ref 44–121)
BUN/Creatinine Ratio: 14 (ref 12–28)
BUN: 18 mg/dL (ref 8–27)
Bilirubin Total: 0.5 mg/dL (ref 0.0–1.2)
CO2: 22 mmol/L (ref 20–29)
Calcium: 9.1 mg/dL (ref 8.7–10.3)
Chloride: 102 mmol/L (ref 96–106)
Creatinine, Ser: 1.3 mg/dL — ABNORMAL HIGH (ref 0.57–1.00)
Globulin, Total: 3.1 g/dL (ref 1.5–4.5)
Glucose: 78 mg/dL (ref 70–99)
Potassium: 4.2 mmol/L (ref 3.5–5.2)
Sodium: 140 mmol/L (ref 134–144)
Total Protein: 7.1 g/dL (ref 6.0–8.5)
eGFR: 46 mL/min/{1.73_m2} — ABNORMAL LOW (ref >59)

## 2023-06-13 LAB — HEMOGLOBIN A1C
Est. average glucose Bld gHb Est-mCnc: 126 mg/dL
Hgb A1c MFr Bld: 6 % — ABNORMAL HIGH (ref 4.8–5.6)

## 2023-06-13 LAB — LIPID PANEL
Chol/HDL Ratio: 3.3 ratio (ref 0.0–4.4)
Cholesterol, Total: 126 mg/dL (ref 100–199)
HDL: 38 mg/dL — ABNORMAL LOW (ref >39)
LDL Chol Calc (NIH): 71 mg/dL (ref 0–99)
Triglycerides: 89 mg/dL (ref 0–149)
VLDL Cholesterol Cal: 17 mg/dL (ref 5–40)

## 2023-06-13 LAB — CK: Total CK: 157 U/L (ref 32–182)

## 2023-06-17 ENCOUNTER — Other Ambulatory Visit: Payer: Self-pay | Admitting: Internal Medicine

## 2023-06-17 DIAGNOSIS — M25552 Pain in left hip: Secondary | ICD-10-CM

## 2023-07-16 ENCOUNTER — Other Ambulatory Visit: Payer: Self-pay | Admitting: Internal Medicine

## 2023-07-16 DIAGNOSIS — E119 Type 2 diabetes mellitus without complications: Secondary | ICD-10-CM

## 2023-08-10 ENCOUNTER — Other Ambulatory Visit: Payer: Self-pay | Admitting: Internal Medicine

## 2023-08-10 DIAGNOSIS — E782 Mixed hyperlipidemia: Secondary | ICD-10-CM

## 2023-08-12 ENCOUNTER — Other Ambulatory Visit: Payer: Self-pay

## 2023-08-12 DIAGNOSIS — E119 Type 2 diabetes mellitus without complications: Secondary | ICD-10-CM

## 2023-09-09 ENCOUNTER — Other Ambulatory Visit: Payer: Self-pay | Admitting: Internal Medicine

## 2023-09-09 DIAGNOSIS — E782 Mixed hyperlipidemia: Secondary | ICD-10-CM

## 2023-09-09 DIAGNOSIS — I10 Essential (primary) hypertension: Secondary | ICD-10-CM

## 2023-09-10 ENCOUNTER — Other Ambulatory Visit: Payer: BC Managed Care – PPO

## 2023-09-11 LAB — COMPREHENSIVE METABOLIC PANEL
ALT: 21 [IU]/L (ref 0–32)
AST: 21 [IU]/L (ref 0–40)
Albumin: 4 g/dL (ref 3.9–4.9)
Alkaline Phosphatase: 53 [IU]/L (ref 44–121)
BUN/Creatinine Ratio: 16 (ref 12–28)
BUN: 19 mg/dL (ref 8–27)
Bilirubin Total: 0.6 mg/dL (ref 0.0–1.2)
CO2: 23 mmol/L (ref 20–29)
Calcium: 9.2 mg/dL (ref 8.7–10.3)
Chloride: 99 mmol/L (ref 96–106)
Creatinine, Ser: 1.16 mg/dL — ABNORMAL HIGH (ref 0.57–1.00)
Globulin, Total: 3.4 g/dL (ref 1.5–4.5)
Glucose: 80 mg/dL (ref 70–99)
Potassium: 3.8 mmol/L (ref 3.5–5.2)
Sodium: 139 mmol/L (ref 134–144)
Total Protein: 7.4 g/dL (ref 6.0–8.5)
eGFR: 53 mL/min/{1.73_m2} — ABNORMAL LOW (ref >59)

## 2023-09-11 LAB — LIPID PANEL
Chol/HDL Ratio: 3.1 {ratio} (ref 0.0–4.4)
Cholesterol, Total: 138 mg/dL (ref 100–199)
HDL: 45 mg/dL (ref >39)
LDL Chol Calc (NIH): 76 mg/dL (ref 0–99)
Triglycerides: 87 mg/dL (ref 0–149)
VLDL Cholesterol Cal: 17 mg/dL (ref 5–40)

## 2023-09-11 LAB — HEMOGLOBIN A1C
Est. average glucose Bld gHb Est-mCnc: 114 mg/dL
Hgb A1c MFr Bld: 5.6 % (ref 4.8–5.6)

## 2023-09-13 ENCOUNTER — Ambulatory Visit: Payer: BC Managed Care – PPO | Admitting: Internal Medicine

## 2023-09-13 ENCOUNTER — Encounter: Payer: Self-pay | Admitting: Internal Medicine

## 2023-09-13 VITALS — BP 117/72 | HR 94 | Temp 98.7°F | Ht 64.0 in | Wt 204.3 lb

## 2023-09-13 DIAGNOSIS — I10 Essential (primary) hypertension: Secondary | ICD-10-CM

## 2023-09-13 DIAGNOSIS — E782 Mixed hyperlipidemia: Secondary | ICD-10-CM

## 2023-09-13 DIAGNOSIS — E119 Type 2 diabetes mellitus without complications: Secondary | ICD-10-CM

## 2023-09-13 DIAGNOSIS — Z1231 Encounter for screening mammogram for malignant neoplasm of breast: Secondary | ICD-10-CM | POA: Diagnosis not present

## 2023-09-13 DIAGNOSIS — N183 Chronic kidney disease, stage 3 unspecified: Secondary | ICD-10-CM

## 2023-09-13 DIAGNOSIS — E0822 Diabetes mellitus due to underlying condition with diabetic chronic kidney disease: Secondary | ICD-10-CM

## 2023-09-13 MED ORDER — OLMESARTAN MEDOXOMIL-HCTZ 40-12.5 MG PO TABS: 1.0000 | ORAL_TABLET | Freq: Every day | ORAL | 0 refills | Status: DC

## 2023-09-13 MED ORDER — EMPAGLIFLOZIN 10 MG PO TABS: 10.0000 mg | ORAL_TABLET | Freq: Every day | ORAL | 0 refills | Status: DC

## 2023-09-13 MED ORDER — ATORVASTATIN CALCIUM 10 MG PO TABS: 10.0000 mg | ORAL_TABLET | Freq: Every day | ORAL | 1 refills | Status: DC

## 2023-09-13 NOTE — Progress Notes (Signed)
Established Patient Office Visit  Subjective:  Patient ID: Vanessa Walters, female    DOB: July 29, 1961  Age: 63 y.o. MRN: [id number]  Chief Complaint  Patient presents with   Follow-up    3 month follow up     No new complaints, here for lab review and medication refills. Labs reviewed and notable for well controlled/uncontrolled diabetes, A1c not / at target, lipids at target with unremarkable cmp. Denies any hypoglycemic episodes and home bg readings have been at target.     No other concerns at this time.   Past Medical History:  Diagnosis Date   Hypertension     Past Surgical History:  Procedure Laterality Date   APPENDECTOMY     CESAREAN SECTION     COLONOSCOPY WITH PROPOFOL N/A 03/17/2015   Procedure: COLONOSCOPY WITH PROPOFOL;  Surgeon: Elnita Maxwell, MD;  Location: [facility] ENDOSCOPY;  Service: Endoscopy;  Laterality: N/A;    Social History   Socioeconomic History   Marital status: Married    Spouse name: Not on file   Number of children: Not on file   Years of education: Not on file   Highest education level: Not on file  Occupational History   Not on file  Tobacco Use   Smoking status: Never   Smokeless tobacco: Not on file  Substance and Sexual Activity   Alcohol use: No   Drug use: No   Sexual activity: Not on file  Other Topics Concern   Not on file  Social History Narrative   Not on file   Social Drivers of Health   Financial Resource Strain: Not on file  Food Insecurity: Not on file  Transportation Needs: Not on file  Physical Activity: Not on file  Stress: Not on file  Social Connections: Not on file  Intimate Partner Violence: Not on file    Family History  Problem Relation Age of Onset   Breast cancer Neg Hx     No Known Allergies  Outpatient Medications Prior to Visit  Medication Sig   ascorbic acid (VITAMIN C) 500 MG tablet Take 500 mg by mouth daily.   cyclobenzaprine (FLEXERIL) 5 MG tablet Take 5 mg by mouth 3 (three)  times daily as needed for muscle spasms.   Multiple Vitamin (MULTIVITAMIN) tablet Take 1 tablet by mouth daily.   Semaglutide, 2 MG/DOSE, (OZEMPIC, 2 MG/DOSE,) 8 MG/3ML SOPN INJECT 2 MG INTO THE SKIN ONCE A WEEK   vitamin B-12 (CYANOCOBALAMIN) 250 MCG tablet Take 250 mcg by mouth daily.   [DISCONTINUED] atorvastatin (LIPITOR) 10 MG tablet TAKE 1 TABLET BY MOUTH AT BEDTIME   [DISCONTINUED] metFORMIN (GLUCOPHAGE) 500 MG tablet Take 1 tablet (500 mg total) by mouth 2 (two) times daily.   [DISCONTINUED] olmesartan-hydrochlorothiazide (BENICAR HCT) 40-25 MG tablet Take 1 tablet by mouth once daily   calcium carbonate (OS-CAL) 600 MG TABS tablet Take 600 mg by mouth 2 (two) times daily with a meal. (Patient not taking: Reported on 11/06/2022)   No facility-administered medications prior to visit.    Review of Systems  Constitutional:  Positive for weight loss (6 lbs).  HENT: Negative.    Eyes: Negative.   Respiratory: Negative.    Cardiovascular: Negative.   Gastrointestinal: Negative.   Genitourinary: Negative.   Skin: Negative.   Neurological: Negative.   Endo/Heme/Allergies: Negative.        Objective:   BP 117/72   Pulse 94   Temp 98.7 F (37.1 C)   Ht 5\' 4"  (  1.626 m)   Wt 204 lb 4.8 oz (92.7 kg)   SpO2 99%   BMI 35.07 kg/m   Vitals:   09/13/23 1123  BP: 117/72  Pulse: 94  Temp: 98.7 F (37.1 C)  Height: 5\' 4"  (1.626 m)  Weight: 204 lb 4.8 oz (92.7 kg)  SpO2: 99%  BMI (Calculated): 35.05    Physical Exam Vitals reviewed.  Constitutional:      General: She is not in acute distress.    Appearance: She is obese.  HENT:     Head: Normocephalic.     Nose: Nose normal.     Mouth/Throat:     Mouth: Mucous membranes are moist.  Eyes:     Extraocular Movements: Extraocular movements intact.     Pupils: Pupils are equal, round, and reactive to light.  Cardiovascular:     Rate and Rhythm: Normal rate and regular rhythm.     Heart sounds: No murmur  heard. Pulmonary:     Effort: Pulmonary effort is normal.     Breath sounds: No rhonchi or rales.  Abdominal:     General: Abdomen is flat.     Palpations: There is no hepatomegaly, splenomegaly or mass.  Musculoskeletal:        General: Normal range of motion.     Cervical back: Normal range of motion. No tenderness.     Lumbar back: Bony tenderness (lumbar-sacral region) present.     Left hip: Tenderness (left bursa) present.  Skin:    General: Skin is warm and dry.  Neurological:     General: No focal deficit present.     Mental Status: She is alert and oriented to person, place, and time.     Cranial Nerves: No cranial nerve deficit.     Motor: No weakness.  Psychiatric:        Mood and Affect: Mood normal.        Behavior: Behavior normal.      No results found for any visits on 09/13/23.  Recent Results (from the past 2160 hours)  Lipid panel     Status: None   Collection Time: 09/10/23  8:23 AM  Result Value Ref Range   Cholesterol, Total 138 100 - 199 mg/dL   Triglycerides 87 0 - 149 mg/dL   HDL 45 >16 mg/dL   VLDL Cholesterol Cal 17 5 - 40 mg/dL   LDL Chol Calc (NIH) 76 0 - 99 mg/dL   Chol/HDL Ratio 3.1 0.0 - 4.4 ratio    Comment:                                   T. Chol/HDL Ratio                                             Men  Women                               1/2 Avg.Risk  3.4    3.3                                   Avg.Risk  5.0    4.4  2X Avg.Risk  9.6    7.1                                3X Avg.Risk 23.4   11.0   Comprehensive metabolic panel     Status: Abnormal   Collection Time: 09/10/23  8:23 AM  Result Value Ref Range   Glucose 80 70 - 99 mg/dL   BUN 19 8 - 27 mg/dL   Creatinine, Ser 4.09 (H) 0.57 - 1.00 mg/dL   eGFR 53 (L) >81 XB/JYN/8.29   BUN/Creatinine Ratio 16 12 - 28   Sodium 139 134 - 144 mmol/L   Potassium 3.8 3.5 - 5.2 mmol/L   Chloride 99 96 - 106 mmol/L   CO2 23 20 - 29 mmol/L   Calcium 9.2 8.7  - 10.3 mg/dL   Total Protein 7.4 6.0 - 8.5 g/dL   Albumin 4.0 3.9 - 4.9 g/dL   Globulin, Total 3.4 1.5 - 4.5 g/dL   Bilirubin Total 0.6 0.0 - 1.2 mg/dL   Alkaline Phosphatase 53 44 - 121 IU/L   AST 21 0 - 40 IU/L   ALT 21 0 - 32 IU/L  Hemoglobin A1c     Status: None   Collection Time: 09/10/23  8:23 AM  Result Value Ref Range   Hgb A1c MFr Bld 5.6 4.8 - 5.6 %    Comment:          Prediabetes: 5.7 - 6.4          Diabetes: >6.4          Glycemic control for adults with diabetes: <7.0    Est. average glucose Bld gHb Est-mCnc 114 mg/dL      Assessment & Plan:  As per problem list Replace Metformin with Jardiance. Problem List Items Addressed This Visit       Cardiovascular and Mediastinum   Primary hypertension   Relevant Medications   olmesartan-hydrochlorothiazide (BENICAR HCT) 40-12.5 MG tablet   atorvastatin (LIPITOR) 10 MG tablet     Endocrine   Controlled type 2 diabetes mellitus without complication, without long-term current use of insulin (HCC) - Primary   Relevant Medications   olmesartan-hydrochlorothiazide (BENICAR HCT) 40-12.5 MG tablet   atorvastatin (LIPITOR) 10 MG tablet   empagliflozin (JARDIANCE) 10 MG TABS tablet     Other   Mixed hyperlipidemia   Relevant Medications   olmesartan-hydrochlorothiazide (BENICAR HCT) 40-12.5 MG tablet   atorvastatin (LIPITOR) 10 MG tablet   Other Visit Diagnoses       Encounter for screening mammogram for malignant neoplasm of breast       Relevant Orders   MM 3D SCREENING MAMMOGRAM BILATERAL BREAST     Diabetes mellitus due to underlying condition with stage 3 chronic kidney disease, without long-term current use of insulin, unspecified whether stage 3a or 3b CKD (HCC)       Relevant Medications   olmesartan-hydrochlorothiazide (BENICAR HCT) 40-12.5 MG tablet   atorvastatin (LIPITOR) 10 MG tablet   empagliflozin (JARDIANCE) 10 MG TABS tablet       Return in about 3 months (around 12/11/2023) for fu with labs  prior.   Total time spent: 20 minutes  Luna Fuse, MD  09/13/2023   This document may have been prepared by [facility] Voice Recognition software and as such may include unintentional dictation errors.

## 2023-09-14 ENCOUNTER — Other Ambulatory Visit: Payer: Self-pay | Admitting: Internal Medicine

## 2023-09-14 DIAGNOSIS — E119 Type 2 diabetes mellitus without complications: Secondary | ICD-10-CM

## 2023-10-23 ENCOUNTER — Ambulatory Visit
Admission: RE | Admit: 2023-10-23 | Discharge: 2023-10-23 | Disposition: A | Discharge status: Home / Self Care | Payer: BC Managed Care – PPO | Source: Ambulatory Visit | Attending: Internal Medicine | Admitting: Internal Medicine

## 2023-10-23 DIAGNOSIS — Z1231 Encounter for screening mammogram for malignant neoplasm of breast: Secondary | ICD-10-CM | POA: Diagnosis present

## 2023-11-01 ENCOUNTER — Ambulatory Visit: Admitting: Family

## 2023-11-01 ENCOUNTER — Encounter: Payer: Self-pay | Admitting: Family

## 2023-11-01 VITALS — BP 118/86 | HR 92 | Ht 66.0 in | Wt 205.0 lb

## 2023-11-01 DIAGNOSIS — Z1151 Encounter for screening for human papillomavirus (HPV): Secondary | ICD-10-CM

## 2023-11-01 DIAGNOSIS — Z0001 Encounter for general adult medical examination with abnormal findings: Secondary | ICD-10-CM | POA: Diagnosis not present

## 2023-11-01 DIAGNOSIS — Z1272 Encounter for screening for malignant neoplasm of vagina: Secondary | ICD-10-CM

## 2023-11-01 DIAGNOSIS — Z01419 Encounter for gynecological examination (general) (routine) without abnormal findings: Secondary | ICD-10-CM

## 2023-11-01 DIAGNOSIS — Z124 Encounter for screening for malignant neoplasm of cervix: Secondary | ICD-10-CM | POA: Diagnosis not present

## 2023-11-01 DIAGNOSIS — Z0130 Encounter for examination of blood pressure without abnormal findings: Secondary | ICD-10-CM

## 2023-11-13 LAB — IGP, APTIMA HPV
HPV Aptima: NEGATIVE
PAP Smear Comment: 0

## 2023-11-30 ENCOUNTER — Other Ambulatory Visit: Payer: Self-pay | Admitting: Internal Medicine

## 2023-11-30 DIAGNOSIS — E119 Type 2 diabetes mellitus without complications: Secondary | ICD-10-CM

## 2023-12-02 ENCOUNTER — Other Ambulatory Visit: Payer: Self-pay

## 2023-12-02 DIAGNOSIS — E0822 Diabetes mellitus due to underlying condition with diabetic chronic kidney disease: Secondary | ICD-10-CM

## 2023-12-02 MED ORDER — EMPAGLIFLOZIN 10 MG PO TABS: 10.0000 mg | ORAL_TABLET | Freq: Every day | ORAL | 0 refills | Status: DC

## 2023-12-10 ENCOUNTER — Other Ambulatory Visit: Payer: Self-pay | Admitting: Internal Medicine

## 2023-12-10 DIAGNOSIS — I10 Essential (primary) hypertension: Secondary | ICD-10-CM

## 2023-12-11 ENCOUNTER — Ambulatory Visit: Payer: BC Managed Care – PPO | Admitting: Internal Medicine

## 2023-12-11 VITALS — BP 115/80 | HR 76 | Temp 98.2°F | Ht 66.0 in | Wt 210.4 lb

## 2023-12-11 DIAGNOSIS — E66811 Obesity, class 1: Secondary | ICD-10-CM | POA: Diagnosis not present

## 2023-12-11 DIAGNOSIS — E782 Mixed hyperlipidemia: Secondary | ICD-10-CM

## 2023-12-11 DIAGNOSIS — I10 Essential (primary) hypertension: Secondary | ICD-10-CM

## 2023-12-11 DIAGNOSIS — E119 Type 2 diabetes mellitus without complications: Secondary | ICD-10-CM | POA: Diagnosis not present

## 2023-12-11 DIAGNOSIS — E6609 Other obesity due to excess calories: Secondary | ICD-10-CM

## 2023-12-11 DIAGNOSIS — Z6834 Body mass index (BMI) 34.0-34.9, adult: Secondary | ICD-10-CM

## 2023-12-11 MED ORDER — OZEMPIC (2 MG/DOSE) 8 MG/3ML ~~LOC~~ SOPN: 2.0000 mg | PEN_INJECTOR | SUBCUTANEOUS | 2 refills | Status: DC

## 2023-12-11 MED ORDER — PHENDIMETRAZINE TARTRATE ER 105 MG PO CP24: 1.0000 | ORAL_CAPSULE | Freq: Every day | ORAL | 1 refills | Status: DC

## 2023-12-11 NOTE — Progress Notes (Signed)
 Established Patient Office Visit  Subjective:  Patient ID: Vanessa Walters, female    DOB: 19-Oct-1960  Age: 63 y.o. MRN: [id number]  Chief Complaint  Patient presents with   Follow-up    3 month lab results    No new complaints, here for lab review and medication refills. Failed to have previsit labs done.Denies any hypoglycemic episodes and home bg readings have been at target. C/o gaining weight but admits to lack of exercise.     No other concerns at this time.   Past Medical History:  Diagnosis Date   Hypertension     Past Surgical History:  Procedure Laterality Date   APPENDECTOMY     CESAREAN SECTION     COLONOSCOPY WITH PROPOFOL  N/A 03/17/2015   Procedure: COLONOSCOPY WITH PROPOFOL ;  Surgeon: Luella Sager, MD;  Location: [facility] ENDOSCOPY;  Service: Endoscopy;  Laterality: N/A;    Social History   Socioeconomic History   Marital status: Married    Spouse name: Not on file   Number of children: Not on file   Years of education: Not on file   Highest education level: Not on file  Occupational History   Not on file  Tobacco Use   Smoking status: Never   Smokeless tobacco: Not on file  Substance and Sexual Activity   Alcohol use: No   Drug use: No   Sexual activity: Not on file  Other Topics Concern   Not on file  Social History Narrative   Not on file   Social Drivers of Health   Financial Resource Strain: Not on file  Food Insecurity: Not on file  Transportation Needs: Not on file  Physical Activity: Not on file  Stress: Not on file  Social Connections: Not on file  Intimate Partner Violence: Not on file    Family History  Problem Relation Age of Onset   Breast cancer Neg Hx     No Known Allergies  Outpatient Medications Prior to Visit  Medication Sig   ascorbic acid (VITAMIN C) 500 MG tablet Take 500 mg by mouth daily.   cyclobenzaprine (FLEXERIL) 5 MG tablet Take 5 mg by mouth 3 (three) times daily as needed for muscle spasms.    empagliflozin  (JARDIANCE ) 10 MG TABS tablet Take 1 tablet (10 mg total) by mouth daily before breakfast.   Multiple Vitamin (MULTIVITAMIN) tablet Take 1 tablet by mouth daily.   olmesartan -hydrochlorothiazide (BENICAR  HCT) 40-12.5 MG tablet Take 1 tablet by mouth once daily   vitamin B-12 (CYANOCOBALAMIN) 250 MCG tablet Take 250 mcg by mouth daily.   [DISCONTINUED] OZEMPIC , 2 MG/DOSE, 8 MG/3ML SOPN INJECT 2 MG SUBCUTANEOUSLY ONCE A WEEK   atorvastatin  (LIPITOR) 10 MG tablet Take 1 tablet (10 mg total) by mouth at bedtime.   [DISCONTINUED] calcium  carbonate (OS-CAL) 600 MG TABS tablet Take 600 mg by mouth 2 (two) times daily with a meal. (Patient not taking: Reported on 11/06/2022)   No facility-administered medications prior to visit.    Review of Systems  Constitutional:  Negative for weight loss (gained 6 lbs).  HENT: Negative.    Eyes: Negative.   Respiratory: Negative.    Cardiovascular: Negative.   Gastrointestinal: Negative.   Genitourinary: Negative.   Skin: Negative.   Neurological: Negative.   Endo/Heme/Allergies: Negative.        Objective:   BP 115/80   Pulse 76   Temp 98.2 F (36.8 C)   Ht 5\' 6"  (1.676 m)   Wt 210 lb 6.4  oz (95.4 kg)   SpO2 99%   BMI 33.96 kg/m   Vitals:   12/11/23 0838  BP: 115/80  Pulse: 76  Temp: 98.2 F (36.8 C)  Height: 5\' 6"  (1.676 m)  Weight: 210 lb 6.4 oz (95.4 kg)  SpO2: 99%  BMI (Calculated): 33.98    Physical Exam Vitals reviewed.  Constitutional:      General: She is not in acute distress.    Appearance: She is obese.  HENT:     Head: Normocephalic.     Nose: Nose normal.     Mouth/Throat:     Mouth: Mucous membranes are moist.  Eyes:     Extraocular Movements: Extraocular movements intact.     Pupils: Pupils are equal, round, and reactive to light.  Cardiovascular:     Rate and Rhythm: Normal rate and regular rhythm.     Heart sounds: No murmur heard. Pulmonary:     Effort: Pulmonary effort is normal.      Breath sounds: No rhonchi or rales.  Abdominal:     General: Abdomen is flat.     Palpations: There is no hepatomegaly, splenomegaly or mass.  Musculoskeletal:        General: Normal range of motion.     Cervical back: Normal range of motion. No tenderness.     Lumbar back: Bony tenderness (lumbar-sacral region) present.     Left hip: Tenderness (left bursa) present.  Skin:    General: Skin is warm and dry.  Neurological:     General: No focal deficit present.     Mental Status: She is alert and oriented to person, place, and time.     Cranial Nerves: No cranial nerve deficit.     Motor: No weakness.  Psychiatric:        Mood and Affect: Mood normal.        Behavior: Behavior normal.    No results found for any visits on 12/11/23.     Assessment & Plan:  As per problem list. Increase exercise. Problem List Items Addressed This Visit       Cardiovascular and Mediastinum   Primary hypertension     Endocrine   Controlled type 2 diabetes mellitus without complication, without long-term current use of insulin (HCC)   Relevant Medications   Semaglutide , 2 MG/DOSE, (OZEMPIC , 2 MG/DOSE,) 8 MG/3ML SOPN     Other   Mixed hyperlipidemia   Class 1 obesity due to excess calories without serious comorbidity with body mass index (BMI) of 34.0 to 34.9 in adult - Primary   Relevant Medications   Phendimetrazine Tartrate 105 MG CP24   Semaglutide , 2 MG/DOSE, (OZEMPIC , 2 MG/DOSE,) 8 MG/3ML SOPN    Return in about 6 weeks (around 01/22/2024) for fu with labs prior, Weight management.   Total time spent: 20 minutes  Arzella Bitters, MD  12/11/2023   This document may have been prepared by [facility] Voice Recognition software and as such may include unintentional dictation errors.

## 2024-01-21 ENCOUNTER — Other Ambulatory Visit: Payer: Self-pay | Admitting: Internal Medicine

## 2024-01-21 ENCOUNTER — Ambulatory Visit: Admitting: Internal Medicine

## 2024-01-21 VITALS — BP 90/54 | HR 82 | Temp 98.1°F | Ht 66.0 in | Wt 201.0 lb

## 2024-01-21 DIAGNOSIS — E66811 Obesity, class 1: Secondary | ICD-10-CM | POA: Diagnosis not present

## 2024-01-21 DIAGNOSIS — E782 Mixed hyperlipidemia: Secondary | ICD-10-CM | POA: Diagnosis not present

## 2024-01-21 DIAGNOSIS — E6609 Other obesity due to excess calories: Secondary | ICD-10-CM

## 2024-01-21 DIAGNOSIS — I10 Essential (primary) hypertension: Secondary | ICD-10-CM | POA: Diagnosis not present

## 2024-01-21 DIAGNOSIS — E119 Type 2 diabetes mellitus without complications: Secondary | ICD-10-CM | POA: Diagnosis not present

## 2024-01-21 DIAGNOSIS — M26621 Arthralgia of right temporomandibular joint: Secondary | ICD-10-CM

## 2024-01-21 DIAGNOSIS — Z6834 Body mass index (BMI) 34.0-34.9, adult: Secondary | ICD-10-CM

## 2024-01-21 MED ORDER — ATORVASTATIN CALCIUM 10 MG PO TABS: 10.0000 mg | ORAL_TABLET | Freq: Every day | ORAL | 1 refills | Status: DC

## 2024-01-21 MED ORDER — OLMESARTAN MEDOXOMIL-HCTZ 20-12.5 MG PO TABS: 1.0000 | ORAL_TABLET | Freq: Every day | ORAL | 0 refills | Status: DC

## 2024-01-21 MED ORDER — PHENDIMETRAZINE TARTRATE ER 105 MG PO CP24: 1.0000 | ORAL_CAPSULE | Freq: Every day | ORAL | 1 refills | Status: DC

## 2024-01-21 NOTE — Progress Notes (Signed)
 Established Patient Office Visit  Subjective:  Patient ID: Vanessa Walters, female    DOB: 05/08/1961  Age: 63 y.o. MRN: [id number]  Chief Complaint  Patient presents with   Follow-up    6 weeks follow up    No new complaints, here for lab review and medication refills. Failed to have previsit labs done. Reports right ear pain x several weeks after she broke her wisdom tooth.     No other concerns at this time.   Past Medical History:  Diagnosis Date   Hypertension     Past Surgical History:  Procedure Laterality Date   APPENDECTOMY     CESAREAN SECTION     COLONOSCOPY WITH PROPOFOL  N/A 03/17/2015   Procedure: COLONOSCOPY WITH PROPOFOL ;  Surgeon: Luella Sager, MD;  Location: [facility] ENDOSCOPY;  Service: Endoscopy;  Laterality: N/A;    Social History   Socioeconomic History   Marital status: Married    Spouse name: Not on file   Number of children: Not on file   Years of education: Not on file   Highest education level: Not on file  Occupational History   Not on file  Tobacco Use   Smoking status: Never   Smokeless tobacco: Not on file  Substance and Sexual Activity   Alcohol use: No   Drug use: No   Sexual activity: Not on file  Other Topics Concern   Not on file  Social History Narrative   Not on file   Social Drivers of Health   Financial Resource Strain: Not on file  Food Insecurity: Not on file  Transportation Needs: Not on file  Physical Activity: Not on file  Stress: Not on file  Social Connections: Not on file  Intimate Partner Violence: Not on file    Family History  Problem Relation Age of Onset   Breast cancer Neg Hx     No Known Allergies  Outpatient Medications Prior to Visit  Medication Sig   ascorbic acid (VITAMIN C) 500 MG tablet Take 500 mg by mouth daily.   cyclobenzaprine (FLEXERIL) 5 MG tablet Take 5 mg by mouth 3 (three) times daily as needed for muscle spasms.   empagliflozin  (JARDIANCE ) 10 MG TABS tablet Take 1  tablet (10 mg total) by mouth daily before breakfast.   Multiple Vitamin (MULTIVITAMIN) tablet Take 1 tablet by mouth daily.   Semaglutide , 2 MG/DOSE, (OZEMPIC , 2 MG/DOSE,) 8 MG/3ML SOPN Inject 2 mg into the skin once a week.   vitamin B-12 (CYANOCOBALAMIN) 250 MCG tablet Take 250 mcg by mouth daily.   [DISCONTINUED] atorvastatin  (LIPITOR) 10 MG tablet Take 1 tablet (10 mg total) by mouth at bedtime.   [DISCONTINUED] olmesartan -hydrochlorothiazide (BENICAR  HCT) 40-12.5 MG tablet Take 1 tablet by mouth once daily   [DISCONTINUED] Phendimetrazine  Tartrate 105 MG CP24 Take 1 capsule (105 mg total) by mouth daily in the afternoon.   No facility-administered medications prior to visit.    Review of Systems  Constitutional:  Positive for weight loss (9 lbs).  HENT:  Positive for ear pain.   Eyes: Negative.   Respiratory: Negative.    Cardiovascular: Negative.   Gastrointestinal: Negative.   Genitourinary: Negative.   Skin: Negative.   Neurological: Negative.   Endo/Heme/Allergies: Negative.        Objective:   BP (!) 90/54   Pulse 82   Temp 98.1 F (36.7 C) (Tympanic)   Ht 5\' 6"  (1.676 m)   Wt 201 lb (91.2 kg)   SpO2 99%  BMI 32.44 kg/m   Vitals:   01/21/24 1039  BP: (!) 90/54  Pulse: 82  Temp: 98.1 F (36.7 C)  Height: 5\' 6"  (1.676 m)  Weight: 201 lb (91.2 kg)  SpO2: 99%  TempSrc: Tympanic  BMI (Calculated): 32.46    Physical Exam Vitals reviewed.  Constitutional:      General: She is not in acute distress.    Appearance: She is obese.  HENT:     Head: Normocephalic.     Jaw: There is normal jaw occlusion.     Right Ear: Hearing and tympanic membrane normal.     Left Ear: Hearing and tympanic membrane normal.     Nose: Nose normal.     Mouth/Throat:     Mouth: Mucous membranes are moist.  Eyes:     Extraocular Movements: Extraocular movements intact.     Pupils: Pupils are equal, round, and reactive to light.  Cardiovascular:     Rate and Rhythm: Normal  rate and regular rhythm.     Heart sounds: No murmur heard. Pulmonary:     Effort: Pulmonary effort is normal.     Breath sounds: No rhonchi or rales.  Abdominal:     General: Abdomen is flat.     Palpations: There is no hepatomegaly, splenomegaly or mass.  Musculoskeletal:        General: Normal range of motion.     Cervical back: Normal range of motion. No tenderness.     Lumbar back: Bony tenderness (lumbar-sacral region) present.     Left hip: Tenderness (left bursa) present.  Skin:    General: Skin is warm and dry.  Neurological:     General: No focal deficit present.     Mental Status: She is alert and oriented to person, place, and time.     Cranial Nerves: No cranial nerve deficit.     Motor: No weakness.  Psychiatric:        Mood and Affect: Mood normal.        Behavior: Behavior normal.      No results found for any visits on 01/21/24.      Assessment & Plan:  As per problem list  Problem List Items Addressed This Visit       Cardiovascular and Mediastinum   Primary hypertension   Relevant Medications   atorvastatin  (LIPITOR) 10 MG tablet   olmesartan -hydrochlorothiazide (BENICAR  HCT) 20-12.5 MG tablet     Endocrine   Controlled type 2 diabetes mellitus without complication, without long-term current use of insulin (HCC) - Primary   Relevant Medications   atorvastatin  (LIPITOR) 10 MG tablet   olmesartan -hydrochlorothiazide (BENICAR  HCT) 20-12.5 MG tablet   Other Relevant Orders   POCT CBG (Fasting - Glucose)     Other   Mixed hyperlipidemia   Relevant Medications   atorvastatin  (LIPITOR) 10 MG tablet   olmesartan -hydrochlorothiazide (BENICAR  HCT) 20-12.5 MG tablet   Class 1 obesity due to excess calories without serious comorbidity with body mass index (BMI) of 34.0 to 34.9 in adult   Relevant Medications   Phendimetrazine  Tartrate 105 MG CP24 (Start on 02/09/2024)   Other Visit Diagnoses       Arthralgia of right temporomandibular joint            Return in about 3 months (around 04/22/2024) for fu with labs prior.   Total time spent: 20 minutes  Arzella Bitters, MD  01/21/2024   This document may have been prepared by [facility] Voice Recognition software  and as such may include unintentional dictation errors.

## 2024-01-22 ENCOUNTER — Ambulatory Visit: Payer: Self-pay | Admitting: Internal Medicine

## 2024-01-22 LAB — COMPREHENSIVE METABOLIC PANEL WITH GFR
ALT: 14 IU/L (ref 0–32)
AST: 19 IU/L (ref 0–40)
Albumin: 4.4 g/dL (ref 3.9–4.9)
Alkaline Phosphatase: 58 IU/L (ref 44–121)
BUN/Creatinine Ratio: 17 (ref 12–28)
BUN: 18 mg/dL (ref 8–27)
Bilirubin Total: 0.8 mg/dL (ref 0.0–1.2)
CO2: 20 mmol/L (ref 20–29)
Calcium: 9.6 mg/dL (ref 8.7–10.3)
Chloride: 97 mmol/L (ref 96–106)
Creatinine, Ser: 1.09 mg/dL — ABNORMAL HIGH (ref 0.57–1.00)
Globulin, Total: 3.2 g/dL (ref 1.5–4.5)
Glucose: 76 mg/dL (ref 70–99)
Potassium: 3.5 mmol/L (ref 3.5–5.2)
Sodium: 136 mmol/L (ref 134–144)
Total Protein: 7.6 g/dL (ref 6.0–8.5)
eGFR: 57 mL/min/{1.73_m2} — ABNORMAL LOW (ref >59)

## 2024-01-22 LAB — LIPID PANEL
Chol/HDL Ratio: 2.9 ratio (ref 0.0–4.4)
Cholesterol, Total: 116 mg/dL (ref 100–199)
HDL: 40 mg/dL (ref >39)
LDL Chol Calc (NIH): 62 mg/dL (ref 0–99)
Triglycerides: 68 mg/dL (ref 0–149)
VLDL Cholesterol Cal: 14 mg/dL (ref 5–40)

## 2024-01-22 LAB — HEMOGLOBIN A1C
Est. average glucose Bld gHb Est-mCnc: 114 mg/dL
Hgb A1c MFr Bld: 5.6 % (ref 4.8–5.6)

## 2024-03-09 NOTE — Progress Notes (Signed)
 Complete physical exam  Patient: Vanessa Walters   DOB: Dec 10, 1960   63 y.o. Female  MRN: [id number]  Subjective:    Chief Complaint  Patient presents with   Follow-up    PAP    Vanessa Walters is a 63 y.o. female who presents today for a complete physical exam. She reports consuming a general diet.  She generally feels fairly well. She does not have additional problems to discuss today.    Past Medical History:  Diagnosis Date   Hypertension     Past Surgical History:  Procedure Laterality Date   APPENDECTOMY     CESAREAN SECTION     COLONOSCOPY WITH PROPOFOL  N/A 03/17/2015   Procedure: COLONOSCOPY WITH PROPOFOL ;  Surgeon: Donnice Vaughn Manes, MD;  Location: [facility] ENDOSCOPY;  Service: Endoscopy;  Laterality: N/A;    Family History  Problem Relation Age of Onset   Breast cancer Neg Hx     Social History   Socioeconomic History   Marital status: Married    Spouse name: Not on file   Number of children: Not on file   Years of education: Not on file   Highest education level: Not on file  Occupational History   Not on file  Tobacco Use   Smoking status: Never   Smokeless tobacco: Not on file  Substance and Sexual Activity   Alcohol use: No   Drug use: No   Sexual activity: Not on file  Other Topics Concern   Not on file  Social History Narrative   Not on file   Social Drivers of Health   Financial Resource Strain: Not on file  Food Insecurity: Not on file  Transportation Needs: Not on file  Physical Activity: Not on file  Stress: Not on file  Social Connections: Not on file  Intimate Partner Violence: Not on file    Outpatient Medications Prior to Visit  Medication Sig   ascorbic acid (VITAMIN C) 500 MG tablet Take 500 mg by mouth daily.   cyclobenzaprine (FLEXERIL) 5 MG tablet Take 5 mg by mouth 3 (three) times daily as needed for muscle spasms.   Multiple Vitamin (MULTIVITAMIN) tablet Take 1 tablet by mouth daily.   vitamin B-12 (CYANOCOBALAMIN)  250 MCG tablet Take 250 mcg by mouth daily.   [DISCONTINUED] atorvastatin  (LIPITOR) 10 MG tablet Take 1 tablet (10 mg total) by mouth at bedtime.   [DISCONTINUED] empagliflozin  (JARDIANCE ) 10 MG TABS tablet Take 1 tablet (10 mg total) by mouth daily before breakfast.   [DISCONTINUED] olmesartan -hydrochlorothiazide (BENICAR  HCT) 40-12.5 MG tablet Take 1 tablet by mouth daily.   [DISCONTINUED] Semaglutide , 2 MG/DOSE, (OZEMPIC , 2 MG/DOSE,) 8 MG/3ML SOPN INJECT 2 MG SUBCUTANEOUSLY ONCE A WEEK   [DISCONTINUED] calcium  carbonate (OS-CAL) 600 MG TABS tablet Take 600 mg by mouth 2 (two) times daily with a meal. (Patient not taking: Reported on 11/06/2022)   No facility-administered medications prior to visit.    Review of Systems  All other systems reviewed and are negative.       Objective:     BP 118/86   Pulse 92   Ht 5' 6 (1.676 m)   Wt 205 lb (93 kg)   SpO2 99%   BMI 33.09 kg/m    Physical Exam Vitals and nursing note reviewed.  Constitutional:      Appearance: Normal appearance. She is normal weight.  HENT:     Head: Normocephalic.  Eyes:     Extraocular Movements: Extraocular movements intact.  Conjunctiva/sclera: Conjunctivae normal.     Pupils: Pupils are equal, round, and reactive to light.  Cardiovascular:     Rate and Rhythm: Normal rate.  Pulmonary:     Effort: Pulmonary effort is normal.  Neurological:     General: No focal deficit present.     Mental Status: She is alert and oriented to person, place, and time. Mental status is at baseline.  Psychiatric:        Mood and Affect: Mood normal.        Behavior: Behavior normal.        Thought Content: Thought content normal.      Results for orders placed or performed in visit on 11/01/23  IGP, Aptima HPV  Result Value Ref Range   DIAGNOSIS: Comment    Specimen adequacy: Comment    Clinician Provided ICD10 Comment    Performed by: Comment    QC reviewed by: Comment    PAP Smear Comment .    Note:  Comment    Test Methodology Comment    HPV Aptima Negative Negative    Recent Results (from the past 2160 hours)  Comprehensive metabolic panel with GFR     Status: Abnormal   Collection Time: 01/21/24 11:01 AM  Result Value Ref Range   Glucose 76 70 - 99 mg/dL   BUN 18 8 - 27 mg/dL   Creatinine, Ser 8.90 (H) 0.57 - 1.00 mg/dL   eGFR 57 (L) >40 fO/fpw/8.26   BUN/Creatinine Ratio 17 12 - 28   Sodium 136 134 - 144 mmol/L   Potassium 3.5 3.5 - 5.2 mmol/L   Chloride 97 96 - 106 mmol/L   CO2 20 20 - 29 mmol/L   Calcium  9.6 8.7 - 10.3 mg/dL   Total Protein 7.6 6.0 - 8.5 g/dL   Albumin 4.4 3.9 - 4.9 g/dL   Globulin, Total 3.2 1.5 - 4.5 g/dL   Bilirubin Total 0.8 0.0 - 1.2 mg/dL   Alkaline Phosphatase 58 44 - 121 IU/L   AST 19 0 - 40 IU/L   ALT 14 0 - 32 IU/L  Lipid panel     Status: None   Collection Time: 01/21/24 11:01 AM  Result Value Ref Range   Cholesterol, Total 116 100 - 199 mg/dL   Triglycerides 68 0 - 149 mg/dL   HDL 40 >60 mg/dL   VLDL Cholesterol Cal 14 5 - 40 mg/dL   LDL Chol Calc (NIH) 62 0 - 99 mg/dL   Chol/HDL Ratio 2.9 0.0 - 4.4 ratio    Comment:                                   T. Chol/HDL Ratio                                             Men  Women                               1/2 Avg.Risk  3.4    3.3                                   Avg.Risk  5.0  4.4                                2X Avg.Risk  9.6    7.1                                3X Avg.Risk 23.4   11.0   Hemoglobin A1c     Status: None   Collection Time: 01/21/24 11:01 AM  Result Value Ref Range   Hgb A1c MFr Bld 5.6 4.8 - 5.6 %    Comment:          Prediabetes: 5.7 - 6.4          Diabetes: >6.4          Glycemic control for adults with diabetes: <7.0    Est. average glucose Bld gHb Est-mCnc 114 mg/dL        Assessment & Plan:    Routine Health Maintenance and Physical Exam  Immunization History  Administered Date(s) Administered   Influenza, Quadrivalent, Recombinant, Inj, Pf  05/14/2023    Health Maintenance  Topic Date Due   HIV Screening  Never done   Hepatitis C Screening  Never done   DTaP/Tdap/Td (1 - Tdap) Never done   Pneumococcal Vaccine 60-48 Years old (1 of 2 - PCV) Never done   Zoster Vaccines- Shingrix (1 of 2) Never done   COVID-19 Vaccine (1 - 2024-25 season) Never done   INFLUENZA VACCINE  03/13/2024   Diabetic kidney evaluation - Urine ACR  06/11/2024   FOOT EXAM  06/11/2024   HEMOGLOBIN A1C  07/22/2024   OPHTHALMOLOGY EXAM  08/12/2024   Diabetic kidney evaluation - eGFR measurement  01/20/2025   Colonoscopy  03/16/2025   MAMMOGRAM  10/22/2025   Cervical Cancer Screening (HPV/Pap Cotest)  10/31/2028   Hepatitis B Vaccines  Aged Out   HPV VACCINES  Aged Out   Meningococcal B Vaccine  Aged Out    Discussed health benefits of physical activity, and encouraged her to engage in regular exercise appropriate for her age and condition.   Return in about 4 months (around 03/02/2024).     ALAN CHRISTELLA ARRANT, FNP  11/01/2023   This document may have been prepared by [facility] Voice Recognition software and as such may include unintentional dictation errors.

## 2024-03-18 ENCOUNTER — Ambulatory Visit: Admitting: Internal Medicine

## 2024-03-18 ENCOUNTER — Encounter: Payer: Self-pay | Admitting: Internal Medicine

## 2024-03-18 VITALS — BP 122/78 | HR 77 | Ht 66.0 in | Wt 201.6 lb

## 2024-03-18 DIAGNOSIS — I10 Essential (primary) hypertension: Secondary | ICD-10-CM | POA: Diagnosis not present

## 2024-03-18 DIAGNOSIS — E119 Type 2 diabetes mellitus without complications: Secondary | ICD-10-CM | POA: Diagnosis not present

## 2024-03-18 DIAGNOSIS — E66811 Obesity, class 1: Secondary | ICD-10-CM

## 2024-03-18 DIAGNOSIS — E6609 Other obesity due to excess calories: Secondary | ICD-10-CM

## 2024-03-18 DIAGNOSIS — E782 Mixed hyperlipidemia: Secondary | ICD-10-CM | POA: Diagnosis not present

## 2024-03-18 DIAGNOSIS — N183 Chronic kidney disease, stage 3 unspecified: Secondary | ICD-10-CM

## 2024-03-18 DIAGNOSIS — E0822 Diabetes mellitus due to underlying condition with diabetic chronic kidney disease: Secondary | ICD-10-CM

## 2024-03-18 DIAGNOSIS — Z6834 Body mass index (BMI) 34.0-34.9, adult: Secondary | ICD-10-CM

## 2024-03-18 MED ORDER — EMPAGLIFLOZIN 10 MG PO TABS: 10.0000 mg | ORAL_TABLET | Freq: Every day | ORAL | 0 refills | Status: AC

## 2024-03-18 MED ORDER — PHENDIMETRAZINE TARTRATE ER 105 MG PO CP24: 1.0000 | ORAL_CAPSULE | Freq: Every day | ORAL | 1 refills | Status: DC

## 2024-03-18 MED ORDER — ATORVASTATIN CALCIUM 10 MG PO TABS: 10.0000 mg | ORAL_TABLET | Freq: Every day | ORAL | 1 refills | Status: DC

## 2024-03-18 MED ORDER — PHENDIMETRAZINE TARTRATE ER 105 MG PO CP24: 1.0000 | ORAL_CAPSULE | Freq: Every day | ORAL | 0 refills | Status: DC

## 2024-03-18 MED ORDER — OLMESARTAN MEDOXOMIL-HCTZ 20-12.5 MG PO TABS: 1.0000 | ORAL_TABLET | Freq: Every day | ORAL | 0 refills | Status: DC

## 2024-03-18 MED ORDER — OZEMPIC (2 MG/DOSE) 8 MG/3ML ~~LOC~~ SOPN: 2.0000 mg | PEN_INJECTOR | SUBCUTANEOUS | 3 refills | Status: DC

## 2024-03-18 NOTE — Progress Notes (Signed)
 Established Patient Office Visit  Subjective:  Patient ID: Vanessa Walters, female    DOB: 1961/08/01  Age: 63 y.o. MRN: [id number]  Chief Complaint  Patient presents with   Follow-up    Weight management    No new complaints, here for lab review and medication refills. Failed to have previsit labs done.     No other concerns at this time.   Past Medical History:  Diagnosis Date   Hypertension     Past Surgical History:  Procedure Laterality Date   APPENDECTOMY     CESAREAN SECTION     COLONOSCOPY WITH PROPOFOL  N/A 03/17/2015   Procedure: COLONOSCOPY WITH PROPOFOL ;  Surgeon: Donnice Vaughn Manes, MD;  Location: [facility] ENDOSCOPY;  Service: Endoscopy;  Laterality: N/A;    Social History   Socioeconomic History   Marital status: Married    Spouse name: Not on file   Number of children: Not on file   Years of education: Not on file   Highest education level: Not on file  Occupational History   Not on file  Tobacco Use   Smoking status: Never   Smokeless tobacco: Not on file  Substance and Sexual Activity   Alcohol use: No   Drug use: No   Sexual activity: Not on file  Other Topics Concern   Not on file  Social History Narrative   Not on file   Social Drivers of Health   Financial Resource Strain: Not on file  Food Insecurity: Not on file  Transportation Needs: Not on file  Physical Activity: Not on file  Stress: Not on file  Social Connections: Not on file  Intimate Partner Violence: Not on file    Family History  Problem Relation Age of Onset   Breast cancer Neg Hx     No Known Allergies  Outpatient Medications Prior to Visit  Medication Sig   ascorbic acid (VITAMIN C) 500 MG tablet Take 500 mg by mouth daily.   cyclobenzaprine (FLEXERIL) 5 MG tablet Take 5 mg by mouth 3 (three) times daily as needed for muscle spasms.   Multiple Vitamin (MULTIVITAMIN) tablet Take 1 tablet by mouth daily.   vitamin B-12 (CYANOCOBALAMIN) 250 MCG tablet Take 250  mcg by mouth daily.   [DISCONTINUED] atorvastatin  (LIPITOR) 10 MG tablet Take 1 tablet (10 mg total) by mouth at bedtime.   [DISCONTINUED] olmesartan -hydrochlorothiazide (BENICAR  HCT) 20-12.5 MG tablet Take 1 tablet by mouth daily.   [DISCONTINUED] Phendimetrazine  Tartrate 105 MG CP24 Take 1 capsule (105 mg total) by mouth daily in the afternoon.   [DISCONTINUED] Semaglutide , 2 MG/DOSE, (OZEMPIC , 2 MG/DOSE,) 8 MG/3ML SOPN Inject 2 mg into the skin once a week.   No facility-administered medications prior to visit.    Review of Systems  Constitutional:  Negative for weight loss.  HENT:  Negative for ear pain.   Eyes: Negative.   Respiratory: Negative.    Cardiovascular: Negative.   Gastrointestinal: Negative.   Genitourinary: Negative.   Skin: Negative.   Neurological: Negative.   Endo/Heme/Allergies: Negative.        Objective:   BP 122/78   Pulse 77   Ht 5' 6 (1.676 m)   Wt 201 lb 9.6 oz (91.4 kg)   SpO2 99%   BMI 32.54 kg/m   Vitals:   03/18/24 0824  BP: 122/78  Pulse: 77  Height: 5' 6 (1.676 m)  Weight: 201 lb 9.6 oz (91.4 kg)  SpO2: 99%  BMI (Calculated): 32.55    Physical  Exam Vitals reviewed.  Constitutional:      General: She is not in acute distress.    Appearance: She is obese.  HENT:     Head: Normocephalic.     Jaw: There is normal jaw occlusion.     Right Ear: Hearing and tympanic membrane normal.     Left Ear: Hearing and tympanic membrane normal.     Nose: Nose normal.     Mouth/Throat:     Mouth: Mucous membranes are moist.  Eyes:     Extraocular Movements: Extraocular movements intact.     Pupils: Pupils are equal, round, and reactive to light.  Cardiovascular:     Rate and Rhythm: Normal rate and regular rhythm.     Heart sounds: No murmur heard. Pulmonary:     Effort: Pulmonary effort is normal.     Breath sounds: No rhonchi or rales.  Abdominal:     General: Abdomen is flat.     Palpations: There is no hepatomegaly, splenomegaly  or mass.  Musculoskeletal:        General: Normal range of motion.     Cervical back: Normal range of motion. No tenderness.     Lumbar back: Bony tenderness (lumbar-sacral region) present.     Left hip: Tenderness (left bursa) present.  Skin:    General: Skin is warm and dry.  Neurological:     General: No focal deficit present.     Mental Status: She is alert and oriented to person, place, and time.     Cranial Nerves: No cranial nerve deficit.     Motor: No weakness.  Psychiatric:        Mood and Affect: Mood normal.        Behavior: Behavior normal.      No results found for any visits on 03/18/24.  Recent Results (from the past 2160 hours)  Comprehensive metabolic panel with GFR     Status: Abnormal   Collection Time: 01/21/24 11:01 AM  Result Value Ref Range   Glucose 76 70 - 99 mg/dL   BUN 18 8 - 27 mg/dL   Creatinine, Ser 8.90 (H) 0.57 - 1.00 mg/dL   eGFR 57 (L) >40 fO/fpw/8.26   BUN/Creatinine Ratio 17 12 - 28   Sodium 136 134 - 144 mmol/L   Potassium 3.5 3.5 - 5.2 mmol/L   Chloride 97 96 - 106 mmol/L   CO2 20 20 - 29 mmol/L   Calcium  9.6 8.7 - 10.3 mg/dL   Total Protein 7.6 6.0 - 8.5 g/dL   Albumin 4.4 3.9 - 4.9 g/dL   Globulin, Total 3.2 1.5 - 4.5 g/dL   Bilirubin Total 0.8 0.0 - 1.2 mg/dL   Alkaline Phosphatase 58 44 - 121 IU/L   AST 19 0 - 40 IU/L   ALT 14 0 - 32 IU/L  Lipid panel     Status: None   Collection Time: 01/21/24 11:01 AM  Result Value Ref Range   Cholesterol, Total 116 100 - 199 mg/dL   Triglycerides 68 0 - 149 mg/dL   HDL 40 >60 mg/dL   VLDL Cholesterol Cal 14 5 - 40 mg/dL   LDL Chol Calc (NIH) 62 0 - 99 mg/dL   Chol/HDL Ratio 2.9 0.0 - 4.4 ratio    Comment:                                   T.  Chol/HDL Ratio                                             Men  Women                               1/2 Avg.Risk  3.4    3.3                                   Avg.Risk  5.0    4.4                                2X Avg.Risk  9.6    7.1                                 3X Avg.Risk 23.4   11.0   Hemoglobin A1c     Status: None   Collection Time: 01/21/24 11:01 AM  Result Value Ref Range   Hgb A1c MFr Bld 5.6 4.8 - 5.6 %    Comment:          Prediabetes: 5.7 - 6.4          Diabetes: >6.4          Glycemic control for adults with diabetes: <7.0    Est. average glucose Bld gHb Est-mCnc 114 mg/dL      Assessment & Plan:  Tynetta was seen today for follow-up.  Class 1 obesity due to excess calories without serious comorbidity with body mass index (BMI) of 34.0 to 34.9 in adult -     Phendimetrazine  Tartrate ER; Take 1 capsule (105 mg total) by mouth daily in the afternoon.  Dispense: 30 capsule; Refill: 0  Mixed hyperlipidemia -     Atorvastatin  Calcium ; Take 1 tablet (10 mg total) by mouth at bedtime.  Dispense: 90 tablet; Refill: 1  Primary hypertension -     Olmesartan  Medoxomil-HCTZ; Take 1 tablet by mouth daily.  Dispense: 90 tablet; Refill: 0  Controlled type 2 diabetes mellitus without complication, without long-term current use of insulin (HCC) -     Ozempic  (2 MG/DOSE); Inject 2 mg into the skin once a week.  Dispense: 3 mL; Refill: 3  Diabetes mellitus due to underlying condition with stage 3 chronic kidney disease, without long-term current use of insulin, unspecified whether stage 3a or 3b CKD (HCC) -     Empagliflozin ; Take 1 tablet (10 mg total) by mouth daily before breakfast.  Dispense: 90 tablet; Refill: 0 -     Hemoglobin A1c; Standing -     Lipid panel; Standing -     Comprehensive metabolic panel with GFR; Standing    Problem List Items Addressed This Visit       Cardiovascular and Mediastinum   Primary hypertension   Relevant Medications   atorvastatin  (LIPITOR) 10 MG tablet   olmesartan -hydrochlorothiazide (BENICAR  HCT) 20-12.5 MG tablet     Endocrine   Controlled type 2 diabetes mellitus without complication, without long-term current use of insulin (HCC)   Relevant Medications   atorvastatin   (LIPITOR) 10 MG tablet   olmesartan -hydrochlorothiazide (BENICAR  HCT) 20-12.5 MG  tablet   Semaglutide , 2 MG/DOSE, (OZEMPIC , 2 MG/DOSE,) 8 MG/3ML SOPN   empagliflozin  (JARDIANCE ) 10 MG TABS tablet     Other   Mixed hyperlipidemia   Relevant Medications   atorvastatin  (LIPITOR) 10 MG tablet   olmesartan -hydrochlorothiazide (BENICAR  HCT) 20-12.5 MG tablet   Class 1 obesity due to excess calories without serious comorbidity with body mass index (BMI) of 34.0 to 34.9 in adult - Primary   Relevant Medications   Semaglutide , 2 MG/DOSE, (OZEMPIC , 2 MG/DOSE,) 8 MG/3ML SOPN   empagliflozin  (JARDIANCE ) 10 MG TABS tablet   Phendimetrazine  Tartrate 105 MG CP24 (Start on 04/10/2024)   Other Visit Diagnoses       Diabetes mellitus due to underlying condition with stage 3 chronic kidney disease, without long-term current use of insulin, unspecified whether stage 3a or 3b CKD (HCC)       Relevant Medications   atorvastatin  (LIPITOR) 10 MG tablet   olmesartan -hydrochlorothiazide (BENICAR  HCT) 20-12.5 MG tablet   Semaglutide , 2 MG/DOSE, (OZEMPIC , 2 MG/DOSE,) 8 MG/3ML SOPN   empagliflozin  (JARDIANCE ) 10 MG TABS tablet   Other Relevant Orders   Hemoglobin A1c   Lipid panel   Comprehensive metabolic panel with GFR       No follow-ups on file.   Total time spent: 20 minutes  Sherrill Cinderella Perry, MD  03/18/2024   This document may have been prepared by [facility] Voice Recognition software and as such may include unintentional dictation errors.

## 2024-05-07 LAB — HEMOGLOBIN A1C
Est. average glucose Bld gHb Est-mCnc: 117 mg/dL
Hgb A1c MFr Bld: 5.7 % — ABNORMAL HIGH (ref 4.8–5.6)

## 2024-05-08 LAB — LIPID PANEL
Chol/HDL Ratio: 2.8 ratio (ref 0.0–4.4)
Cholesterol, Total: 127 mg/dL (ref 100–199)
HDL: 45 mg/dL (ref >39)
LDL Chol Calc (NIH): 67 mg/dL (ref 0–99)
Triglycerides: 77 mg/dL (ref 0–149)
VLDL Cholesterol Cal: 15 mg/dL (ref 5–40)

## 2024-05-08 LAB — COMPREHENSIVE METABOLIC PANEL WITH GFR
ALT: 17 IU/L (ref 0–32)
AST: 16 IU/L (ref 0–40)
Albumin: 4 g/dL (ref 3.9–4.9)
Alkaline Phosphatase: 67 IU/L (ref 49–135)
BUN/Creatinine Ratio: 10 — ABNORMAL LOW (ref 12–28)
BUN: 10 mg/dL (ref 8–27)
Bilirubin Total: 0.5 mg/dL (ref 0.0–1.2)
CO2: 22 mmol/L (ref 20–29)
Calcium: 9.3 mg/dL (ref 8.7–10.3)
Chloride: 100 mmol/L (ref 96–106)
Creatinine, Ser: 1.03 mg/dL — ABNORMAL HIGH (ref 0.57–1.00)
Globulin, Total: 3.1 g/dL (ref 1.5–4.5)
Glucose: 76 mg/dL (ref 70–99)
Potassium: 4.1 mmol/L (ref 3.5–5.2)
Sodium: 139 mmol/L (ref 134–144)
Total Protein: 7.1 g/dL (ref 6.0–8.5)
eGFR: 61 mL/min/1.73 (ref >59)

## 2024-05-13 ENCOUNTER — Encounter: Payer: Self-pay | Admitting: Internal Medicine

## 2024-05-13 ENCOUNTER — Ambulatory Visit: Admitting: Internal Medicine

## 2024-05-13 ENCOUNTER — Ambulatory Visit: Payer: Self-pay | Admitting: Internal Medicine

## 2024-05-13 VITALS — BP 110/80 | HR 75 | Temp 97.7°F | Ht 66.0 in | Wt 200.0 lb

## 2024-05-13 DIAGNOSIS — R0789 Other chest pain: Secondary | ICD-10-CM

## 2024-05-13 DIAGNOSIS — E1122 Type 2 diabetes mellitus with diabetic chronic kidney disease: Secondary | ICD-10-CM | POA: Diagnosis not present

## 2024-05-13 DIAGNOSIS — E782 Mixed hyperlipidemia: Secondary | ICD-10-CM | POA: Diagnosis not present

## 2024-05-13 DIAGNOSIS — E66811 Obesity, class 1: Secondary | ICD-10-CM

## 2024-05-13 DIAGNOSIS — I129 Hypertensive chronic kidney disease with stage 1 through stage 4 chronic kidney disease, or unspecified chronic kidney disease: Secondary | ICD-10-CM

## 2024-05-13 DIAGNOSIS — N182 Chronic kidney disease, stage 2 (mild): Secondary | ICD-10-CM

## 2024-05-13 DIAGNOSIS — E119 Type 2 diabetes mellitus without complications: Secondary | ICD-10-CM

## 2024-05-13 DIAGNOSIS — N183 Chronic kidney disease, stage 3 unspecified: Secondary | ICD-10-CM

## 2024-05-13 DIAGNOSIS — I10 Essential (primary) hypertension: Secondary | ICD-10-CM | POA: Diagnosis not present

## 2024-05-13 MED ORDER — PHENDIMETRAZINE TARTRATE 35 MG PO TABS: 1.0000 | ORAL_TABLET | Freq: Three times a day (TID) | ORAL | 1 refills | Status: AC

## 2024-05-13 MED ORDER — OZEMPIC (2 MG/DOSE) 8 MG/3ML ~~LOC~~ SOPN: 2.0000 mg | PEN_INJECTOR | SUBCUTANEOUS | 3 refills | Status: DC

## 2024-05-13 NOTE — Progress Notes (Signed)
 Established Patient Office Visit  Subjective:  Patient ID: Vanessa Walters, female    DOB: 11/04/1960  Age: 63 y.o. MRN: [id number]  Chief Complaint  Patient presents with   Follow-up    6 week follow up, discuss lab results    No new complaints, here for lab review and medication refills. Labs reviewed and notable for well controlled diabetes, A1c at target, lipids at target with cmp notable for improvement in gfr. Denies any hypoglycemic episodes and home bg readings have been at target. Did not take Bontril  as not covered by insurance. C/o intermittent sharp mid back pain unrelated to exertion which is relieved spontaneously without analgesia or change in position. Pain is nonradiating, nonpleuritic with 8/10 severity. Denies dyspepsia.     No other concerns at this time.   Past Medical History:  Diagnosis Date   Hypertension     Past Surgical History:  Procedure Laterality Date   APPENDECTOMY     CESAREAN SECTION     COLONOSCOPY WITH PROPOFOL  N/A 03/17/2015   Procedure: COLONOSCOPY WITH PROPOFOL ;  Surgeon: Donnice Vaughn Manes, MD;  Location: [facility] ENDOSCOPY;  Service: Endoscopy;  Laterality: N/A;    Social History   Socioeconomic History   Marital status: Married    Spouse name: Not on file   Number of children: Not on file   Years of education: Not on file   Highest education level: Not on file  Occupational History   Not on file  Tobacco Use   Smoking status: Never   Smokeless tobacco: Not on file  Substance and Sexual Activity   Alcohol use: No   Drug use: No   Sexual activity: Not on file  Other Topics Concern   Not on file  Social History Narrative   Not on file   Social Drivers of Health   Financial Resource Strain: Not on file  Food Insecurity: Not on file  Transportation Needs: Not on file  Physical Activity: Not on file  Stress: Not on file  Social Connections: Not on file  Intimate Partner Violence: Not on file    Family History  Problem  Relation Age of Onset   Breast cancer Neg Hx     No Known Allergies  Outpatient Medications Prior to Visit  Medication Sig   atorvastatin  (LIPITOR) 10 MG tablet Take 1 tablet (10 mg total) by mouth at bedtime.   empagliflozin  (JARDIANCE ) 10 MG TABS tablet Take 1 tablet (10 mg total) by mouth daily before breakfast.   Multiple Vitamin (MULTIVITAMIN) tablet Take 1 tablet by mouth daily.   olmesartan -hydrochlorothiazide (BENICAR  HCT) 20-12.5 MG tablet Take 1 tablet by mouth daily.   vitamin B-12 (CYANOCOBALAMIN) 250 MCG tablet Take 250 mcg by mouth daily.   [DISCONTINUED] Semaglutide , 2 MG/DOSE, (OZEMPIC , 2 MG/DOSE,) 8 MG/3ML SOPN Inject 2 mg into the skin once a week.   ascorbic acid (VITAMIN C) 500 MG tablet Take 500 mg by mouth daily. (Patient not taking: Reported on 05/13/2024)   cyclobenzaprine (FLEXERIL) 5 MG tablet Take 5 mg by mouth 3 (three) times daily as needed for muscle spasms. (Patient not taking: Reported on 05/13/2024)   [DISCONTINUED] Phendimetrazine  Tartrate 105 MG CP24 Take 1 capsule (105 mg total) by mouth daily in the afternoon. (Patient not taking: Reported on 05/13/2024)   No facility-administered medications prior to visit.    Review of Systems  Constitutional:  Positive for weight loss (1 lb).  HENT:  Negative for ear pain.   Eyes: Negative.  Respiratory: Negative.    Cardiovascular: Negative.   Gastrointestinal: Negative.   Genitourinary: Negative.   Skin: Negative.   Neurological: Negative.   Endo/Heme/Allergies: Negative.        Objective:   BP 110/80   Pulse 75   Temp 97.7 F (36.5 C) (Tympanic)   Ht 5' 6 (1.676 m)   Wt 200 lb (90.7 kg)   SpO2 99%   BMI 32.28 kg/m   Vitals:   05/13/24 0841  BP: 110/80  Pulse: 75  Temp: 97.7 F (36.5 C)  Height: 5' 6 (1.676 m)  Weight: 200 lb (90.7 kg)  SpO2: 99%  TempSrc: Tympanic  BMI (Calculated): 32.3    Physical Exam Vitals reviewed.  Constitutional:      General: She is not in acute  distress.    Appearance: She is obese.  HENT:     Head: Normocephalic.     Jaw: There is normal jaw occlusion.     Right Ear: Hearing and tympanic membrane normal.     Left Ear: Hearing and tympanic membrane normal.     Nose: Nose normal.     Mouth/Throat:     Mouth: Mucous membranes are moist.  Eyes:     Extraocular Movements: Extraocular movements intact.     Pupils: Pupils are equal, round, and reactive to light.  Cardiovascular:     Rate and Rhythm: Normal rate and regular rhythm.     Heart sounds: No murmur heard. Pulmonary:     Effort: Pulmonary effort is normal.     Breath sounds: No rhonchi or rales.  Abdominal:     General: Abdomen is flat.     Palpations: There is no hepatomegaly, splenomegaly or mass.  Musculoskeletal:        General: Normal range of motion.     Cervical back: Normal range of motion. No tenderness.     Thoracic back: No spasms or tenderness.  Skin:    General: Skin is warm and dry.  Neurological:     General: No focal deficit present.     Mental Status: She is alert and oriented to person, place, and time.     Cranial Nerves: No cranial nerve deficit.     Motor: No weakness.  Psychiatric:        Mood and Affect: Mood normal.        Behavior: Behavior normal.      No results found for any visits on 05/13/24.  Recent Results (from the past 2160 hours)  Lipid panel     Status: None   Collection Time: 05/07/24  8:24 AM  Result Value Ref Range   Cholesterol, Total 127 100 - 199 mg/dL   Triglycerides 77 0 - 149 mg/dL   HDL 45 >60 mg/dL   VLDL Cholesterol Cal 15 5 - 40 mg/dL   LDL Chol Calc (NIH) 67 0 - 99 mg/dL   Chol/HDL Ratio 2.8 0.0 - 4.4 ratio    Comment:                                   T. Chol/HDL Ratio                                             Men  Women  1/2 Avg.Risk  3.4    3.3                                   Avg.Risk  5.0    4.4                                2X Avg.Risk  9.6    7.1                                 3X Avg.Risk 23.4   11.0   Comprehensive metabolic panel with GFR     Status: Abnormal   Collection Time: 05/07/24  8:41 AM  Result Value Ref Range   Glucose 76 70 - 99 mg/dL   BUN 10 8 - 27 mg/dL   Creatinine, Ser 8.96 (H) 0.57 - 1.00 mg/dL   eGFR 61 >40 fO/fpw/8.26   BUN/Creatinine Ratio 10 (L) 12 - 28   Sodium 139 134 - 144 mmol/L   Potassium 4.1 3.5 - 5.2 mmol/L   Chloride 100 96 - 106 mmol/L   CO2 22 20 - 29 mmol/L   Calcium  9.3 8.7 - 10.3 mg/dL   Total Protein 7.1 6.0 - 8.5 g/dL   Albumin 4.0 3.9 - 4.9 g/dL   Globulin, Total 3.1 1.5 - 4.5 g/dL   Bilirubin Total 0.5 0.0 - 1.2 mg/dL   Alkaline Phosphatase 67 49 - 135 IU/L    Comment:               **Please note reference interval change**   AST 16 0 - 40 IU/L   ALT 17 0 - 32 IU/L  Hemoglobin A1c     Status: Abnormal   Collection Time: 05/07/24  8:41 AM  Result Value Ref Range   Hgb A1c MFr Bld 5.7 (H) 4.8 - 5.6 %    Comment:          Prediabetes: 5.7 - 6.4          Diabetes: >6.4          Glycemic control for adults with diabetes: <7.0    Est. average glucose Bld gHb Est-mCnc 117 mg/dL      Assessment & Plan:  Aibhlinn was seen today for follow-up.  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  Dispense: 3 mL; Refill: 3  Mixed hyperlipidemia -     Comprehensive metabolic panel with GFR -     Lipid panel  Primary hypertension  Obesity (BMI 30.0-34.9) -     Phendimetrazine  Tartrate; Take 1 tablet (35 mg total) by mouth 3 (three) times daily.  Dispense: 90 tablet; Refill: 1  Controlled type 2 diabetes mellitus without complication, without long-term current use of insulin (HCC)  Atypical chest pain -     Ambulatory referral to Cardiology  Diabetes mellitus due to underlying condition with stage 3 chronic kidney disease, without long-term current use of insulin, unspecified whether stage 3a or 3b CKD (HCC) -     Hemoglobin  A1c    Problem List Items Addressed This Visit       Cardiovascular and Mediastinum   Primary hypertension     Endocrine   Controlled type  2 diabetes mellitus without complication, without long-term current use of insulin (HCC)   Relevant Medications   Semaglutide , 2 MG/DOSE, (OZEMPIC , 2 MG/DOSE,) 8 MG/3ML SOPN     Other   Mixed hyperlipidemia   Other Visit Diagnoses       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)    -  Primary   Relevant Medications   Semaglutide , 2 MG/DOSE, (OZEMPIC , 2 MG/DOSE,) 8 MG/3ML SOPN     Obesity (BMI 30.0-34.9)       Relevant Medications   Phendimetrazine  Tartrate 35 MG TABS     Atypical chest pain       Relevant Orders   Ambulatory referral to Cardiology     Diabetes mellitus due to underlying condition with stage 3 chronic kidney disease, without long-term current use of insulin, unspecified whether stage 3a or 3b CKD (HCC)       Relevant Medications   Semaglutide , 2 MG/DOSE, (OZEMPIC , 2 MG/DOSE,) 8 MG/3ML SOPN       Return in about 6 weeks (around 06/24/2024) for Weight management.   Total time spent: 20 minutes  Sherrill Cinderella Perry, MD  05/13/2024   This document may have been prepared by [facility] Voice Recognition software and as such may include unintentional dictation errors.

## 2024-05-26 ENCOUNTER — Other Ambulatory Visit: Payer: Self-pay | Admitting: Internal Medicine

## 2024-05-26 DIAGNOSIS — E782 Mixed hyperlipidemia: Secondary | ICD-10-CM

## 2024-05-26 DIAGNOSIS — R0789 Other chest pain: Secondary | ICD-10-CM

## 2024-06-05 ENCOUNTER — Ambulatory Visit
Admission: RE | Admit: 2024-06-05 | Discharge: 2024-06-05 | Disposition: A | Discharge status: Home / Self Care | Payer: Self-pay | Source: Ambulatory Visit | Attending: Internal Medicine | Admitting: Internal Medicine

## 2024-06-05 DIAGNOSIS — R0789 Other chest pain: Secondary | ICD-10-CM | POA: Insufficient documentation

## 2024-06-05 DIAGNOSIS — E782 Mixed hyperlipidemia: Secondary | ICD-10-CM | POA: Insufficient documentation

## 2024-08-29 LAB — LIPID PANEL
Chol/HDL Ratio: 2.9 ratio (ref 0.0–4.4)
Cholesterol, Total: 115 mg/dL (ref 100–199)
HDL: 40 mg/dL
LDL Chol Calc (NIH): 58 mg/dL (ref 0–99)
Triglycerides: 87 mg/dL (ref 0–149)
VLDL Cholesterol Cal: 17 mg/dL (ref 5–40)

## 2024-08-29 LAB — COMPREHENSIVE METABOLIC PANEL WITH GFR
ALT: 14 IU/L (ref 0–32)
AST: 17 IU/L (ref 0–40)
Albumin: 4.1 g/dL (ref 3.9–4.9)
Alkaline Phosphatase: 70 IU/L (ref 49–135)
BUN/Creatinine Ratio: 12 (ref 12–28)
BUN: 12 mg/dL (ref 8–27)
Bilirubin Total: 0.8 mg/dL (ref 0.0–1.2)
CO2: 23 mmol/L (ref 20–29)
Calcium: 9.4 mg/dL (ref 8.7–10.3)
Chloride: 100 mmol/L (ref 96–106)
Creatinine, Ser: 1.02 mg/dL — ABNORMAL HIGH (ref 0.57–1.00)
Globulin, Total: 3.4 g/dL (ref 1.5–4.5)
Glucose: 79 mg/dL (ref 70–99)
Potassium: 4 mmol/L (ref 3.5–5.2)
Sodium: 138 mmol/L (ref 134–144)
Total Protein: 7.5 g/dL (ref 6.0–8.5)
eGFR: 62 mL/min/1.73

## 2024-08-29 LAB — HEMOGLOBIN A1C
Est. average glucose Bld gHb Est-mCnc: 117 mg/dL
Hgb A1c MFr Bld: 5.7 % — ABNORMAL HIGH (ref 4.8–5.6)

## 2024-09-02 ENCOUNTER — Encounter: Payer: Self-pay | Admitting: Internal Medicine

## 2024-09-02 ENCOUNTER — Ambulatory Visit: Payer: Self-pay | Admitting: Internal Medicine

## 2024-09-02 ENCOUNTER — Ambulatory Visit: Admitting: Internal Medicine

## 2024-09-02 VITALS — BP 126/78 | HR 81 | Temp 97.8°F | Ht 66.0 in | Wt 198.0 lb

## 2024-09-02 DIAGNOSIS — E1122 Type 2 diabetes mellitus with diabetic chronic kidney disease: Secondary | ICD-10-CM

## 2024-09-02 DIAGNOSIS — I129 Hypertensive chronic kidney disease with stage 1 through stage 4 chronic kidney disease, or unspecified chronic kidney disease: Secondary | ICD-10-CM

## 2024-09-02 DIAGNOSIS — I10 Essential (primary) hypertension: Secondary | ICD-10-CM | POA: Diagnosis not present

## 2024-09-02 DIAGNOSIS — E0822 Diabetes mellitus due to underlying condition with diabetic chronic kidney disease: Secondary | ICD-10-CM

## 2024-09-02 DIAGNOSIS — E782 Mixed hyperlipidemia: Secondary | ICD-10-CM

## 2024-09-02 DIAGNOSIS — N182 Chronic kidney disease, stage 2 (mild): Secondary | ICD-10-CM

## 2024-09-02 DIAGNOSIS — N183 Chronic kidney disease, stage 3 unspecified: Secondary | ICD-10-CM | POA: Diagnosis not present

## 2024-09-02 LAB — POC CREATINE & ALBUMIN,URINE
Creatinine, POC: 10 mg/dL
Microalbumin Ur, POC: 30 mg/L

## 2024-09-02 MED ORDER — OZEMPIC (2 MG/DOSE) 8 MG/3ML ~~LOC~~ SOPN: 2.0000 mg | PEN_INJECTOR | SUBCUTANEOUS | 3 refills | Status: AC

## 2024-09-02 MED ORDER — OLMESARTAN MEDOXOMIL-HCTZ 20-12.5 MG PO TABS: 1.0000 | ORAL_TABLET | Freq: Every day | ORAL | 0 refills | Status: AC

## 2024-09-02 MED ORDER — ATORVASTATIN CALCIUM 10 MG PO TABS: 10.0000 mg | ORAL_TABLET | Freq: Every day | ORAL | 1 refills | Status: AC

## 2024-09-02 NOTE — Progress Notes (Signed)
 "  Established Patient Office Visit  Subjective:  Patient ID: Vanessa Walters, female    DOB: Aug 02, 1961  Age: 64 y.o. MRN: [id number]  Chief Complaint  Patient presents with   Follow-up    3 month follow up    No new complaints, here for lab review and medication refills. Labs reviewed and notable for well controlled diabetes, A1c at target, lipids at target with cmp notable for normalization of gfr. Denies any hypoglycemic episodes and home bg readings have been at target.     No other concerns at this time.   Past Medical History:  Diagnosis Date   Hypertension     Past Surgical History:  Procedure Laterality Date   APPENDECTOMY     CESAREAN SECTION     COLONOSCOPY WITH PROPOFOL  N/A 03/17/2015   Procedure: COLONOSCOPY WITH PROPOFOL ;  Surgeon: Donnice Vaughn Manes, MD;  Location: [facility] ENDOSCOPY;  Service: Endoscopy;  Laterality: N/A;    Social History   Socioeconomic History   Marital status: Married    Spouse name: Not on file   Number of children: Not on file   Years of education: Not on file   Highest education level: Not on file  Occupational History   Not on file  Tobacco Use   Smoking status: Never   Smokeless tobacco: Not on file  Substance and Sexual Activity   Alcohol use: No   Drug use: No   Sexual activity: Not on file  Other Topics Concern   Not on file  Social History Narrative   Not on file   Social Drivers of Health   Tobacco Use: Low Risk  (05/26/2024)   Received from [facility]   Patient History    Smoking Tobacco Use: Never    Smokeless Tobacco Use: Never    Passive Exposure: Not on file  Financial Resource Strain: Not on file  Food Insecurity: Not on file  Transportation Needs: Not on file  Physical Activity: Not on file  Stress: Not on file  Social Connections: Not on file  Intimate Partner Violence: Not on file  Depression (EYV7-0): Not on file  Alcohol Screen: Not on file  Housing: Unknown (05/26/2024)    Received from [facility]   Epic    Unable to Pay for Housing in the Last Year: Not on file    Number of Times Moved in the Last Year: Not on file    At any time in the past 12 months, were you homeless or living in a shelter (including now)?: No  Utilities: Not on file  Health Literacy: Not on file    Family History  Problem Relation Age of Onset   Breast cancer Neg Hx     Allergies[1]  Show/hide medication list[2]  Review of Systems  Constitutional:  Positive for weight loss (2 lbs).  HENT:  Negative for ear pain.   Eyes: Negative.   Respiratory: Negative.    Cardiovascular: Negative.   Gastrointestinal: Negative.   Genitourinary: Negative.   Skin: Negative.   Neurological: Negative.   Endo/Heme/Allergies: Negative.        Objective:   BP 126/78   Pulse 81   Temp 97.8 F (36.6 C)   Ht 5' 6 (1.676 m)   Wt 198 lb (89.8 kg)   SpO2 97%   BMI 31.96 kg/m   Vitals:   09/02/24 0837  BP: 126/78  Pulse: 81  Temp: 97.8 F (36.6 C)  Height: 5' 6 (1.676  m)  Weight: 198 lb (89.8 kg)  SpO2: 97%  BMI (Calculated): 31.97    Physical Exam Vitals reviewed.  Constitutional:      General: She is not in acute distress.    Appearance: She is obese.  HENT:     Head: Normocephalic.     Jaw: There is normal jaw occlusion.     Right Ear: Hearing and tympanic membrane normal.     Left Ear: Hearing and tympanic membrane normal.     Nose: Nose normal.     Mouth/Throat:     Mouth: Mucous membranes are moist.  Eyes:     Extraocular Movements: Extraocular movements intact.     Pupils: Pupils are equal, round, and reactive to light.  Cardiovascular:     Rate and Rhythm: Normal rate and regular rhythm.     Heart sounds: No murmur heard. Pulmonary:     Effort: Pulmonary effort is normal.     Breath sounds: No rhonchi or rales.  Abdominal:     General: Abdomen is flat.     Palpations: There is no hepatomegaly, splenomegaly or mass.  Musculoskeletal:         General: Normal range of motion.     Cervical back: Normal range of motion. No tenderness.     Thoracic back: No spasms or tenderness.  Skin:    General: Skin is warm and dry.  Neurological:     General: No focal deficit present.     Mental Status: She is alert and oriented to person, place, and time.     Cranial Nerves: No cranial nerve deficit.     Motor: No weakness.  Psychiatric:        Mood and Affect: Mood normal.        Behavior: Behavior normal.      No results found for any visits on 09/02/24.  Recent Results (from the past 2160 hours)  Lipid panel     Status: None   Collection Time: 08/28/24  9:24 AM  Result Value Ref Range   Cholesterol, Total 115 100 - 199 mg/dL   Triglycerides 87 0 - 149 mg/dL   HDL 40 >60 mg/dL   VLDL Cholesterol Cal 17 5 - 40 mg/dL   LDL Chol Calc (NIH) 58 0 - 99 mg/dL   Chol/HDL Ratio 2.9 0.0 - 4.4 ratio    Comment:                                   T. Chol/HDL Ratio                                             Men  Women                               1/2 Avg.Risk  3.4    3.3                                   Avg.Risk  5.0    4.4  2X Avg.Risk  9.6    7.1                                3X Avg.Risk 23.4   11.0   Hemoglobin A1c     Status: Abnormal   Collection Time: 08/28/24  9:29 AM  Result Value Ref Range   Hgb A1c MFr Bld 5.7 (H) 4.8 - 5.6 %    Comment:          Prediabetes: 5.7 - 6.4          Diabetes: >6.4          Glycemic control for adults with diabetes: <7.0    Est. average glucose Bld gHb Est-mCnc 117 mg/dL  Comprehensive metabolic panel with GFR     Status: Abnormal   Collection Time: 08/28/24  9:30 AM  Result Value Ref Range   Glucose 79 70 - 99 mg/dL   BUN 12 8 - 27 mg/dL   Creatinine, Ser 8.97 (H) 0.57 - 1.00 mg/dL   eGFR 62 >40 fO/fpw/8.26   BUN/Creatinine Ratio 12 12 - 28   Sodium 138 134 - 144 mmol/L   Potassium 4.0 3.5 - 5.2 mmol/L   Chloride 100 96 - 106 mmol/L   CO2 23 20 -  29 mmol/L   Calcium  9.4 8.7 - 10.3 mg/dL   Total Protein 7.5 6.0 - 8.5 g/dL   Albumin 4.1 3.9 - 4.9 g/dL   Globulin, Total 3.4 1.5 - 4.5 g/dL   Bilirubin Total 0.8 0.0 - 1.2 mg/dL   Alkaline Phosphatase 70 49 - 135 IU/L   AST 17 0 - 40 IU/L   ALT 14 0 - 32 IU/L      Assessment & Plan:  Samiah was seen today for follow-up.  Primary hypertension -     Olmesartan  Medoxomil-HCTZ; Take 1 tablet by mouth daily.  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  Dispense: 3 mL; Refill: 3  Mixed hyperlipidemia -     Atorvastatin  Calcium ; Take 1 tablet (10 mg total) by mouth at bedtime.  Dispense: 90 tablet; Refill: 1  Microalbuminuria, however gfr is improving. Will discuss addition of SGLT-2 at next visit.  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   Total time spent: 20 minutes. This time includes review of previous notes and results and patient face to face interaction during today'Zaedyn Covin visit.    Sherrill Cinderella Perry, MD  09/02/2024   This document may have been prepared by [facility] Voice Recognition  software and as such may include unintentional dictation errors.     [1] No  Known Allergies [2]  Outpatient Medications Prior to Visit  Medication Sig   Multiple Vitamin (MULTIVITAMIN) tablet Take 1 tablet by mouth daily.   vitamin B-12 (CYANOCOBALAMIN) 250 MCG tablet Take 250 mcg by mouth daily.   [DISCONTINUED] atorvastatin  (LIPITOR) 10 MG tablet Take 1 tablet (10 mg total) by mouth at bedtime.   [DISCONTINUED] olmesartan -hydrochlorothiazide (BENICAR  HCT) 20-12.5 MG tablet Take 1 tablet by mouth daily.   [DISCONTINUED] Semaglutide , 2 MG/DOSE, (OZEMPIC , 2 MG/DOSE,) 8 MG/3ML SOPN Inject 2 mg into the skin once a week.   ascorbic acid (VITAMIN C) 500 MG tablet Take 500 mg by mouth daily. (Patient not taking: Reported on 09/02/2024)   cyclobenzaprine (FLEXERIL) 5 MG tablet Take 5 mg by mouth 3 (three) times daily as needed for muscle spasms. (Patient not taking: Reported on 09/02/2024)   Phendimetrazine  Tartrate 35 MG TABS Take 1 tablet (35 mg total) by mouth 3 (three) times daily. (Patient not taking: Reported on 09/02/2024)   No facility-administered medications prior to visit.   "

## 2024-12-09 ENCOUNTER — Ambulatory Visit: Admitting: Internal Medicine
